# Patient Record
Sex: Female | Born: 1965
Health system: Southern US, Community
[De-identification: ages and names within clinical notes are randomized; demographics above are authoritative.]

## PROBLEM LIST (undated history)

## (undated) DIAGNOSIS — Z800 Family history of malignant neoplasm of digestive organs: Secondary | ICD-10-CM

## (undated) DIAGNOSIS — Z83719 Family history of colon polyps, unspecified: Secondary | ICD-10-CM

## (undated) DIAGNOSIS — E119 Type 2 diabetes mellitus without complications: Secondary | ICD-10-CM

## (undated) DIAGNOSIS — B029 Zoster without complications: Secondary | ICD-10-CM

## (undated) DIAGNOSIS — J45909 Unspecified asthma, uncomplicated: Secondary | ICD-10-CM

## (undated) DIAGNOSIS — I10 Essential (primary) hypertension: Secondary | ICD-10-CM

## (undated) DIAGNOSIS — K635 Polyp of colon: Secondary | ICD-10-CM

## (undated) DIAGNOSIS — R87619 Unspecified abnormal cytological findings in specimens from cervix uteri: Secondary | ICD-10-CM

## (undated) DIAGNOSIS — E785 Hyperlipidemia, unspecified: Secondary | ICD-10-CM

## (undated) DIAGNOSIS — Z8371 Family history of colonic polyps: Secondary | ICD-10-CM

## (undated) HISTORY — DX: Essential (primary) hypertension: I10

## (undated) HISTORY — DX: Family history of malignant neoplasm of digestive organs: Z80.0

## (undated) HISTORY — PX: APPENDECTOMY: SHX54

## (undated) HISTORY — DX: Family history of colon polyps, unspecified: Z83.719

## (undated) HISTORY — PX: CHOLECYSTECTOMY: SHX55

## (undated) HISTORY — DX: Unspecified abnormal cytological findings in specimens from cervix uteri: R87.619

## (undated) HISTORY — PX: TONSILLECTOMY AND ADENOIDECTOMY: SUR1326

## (undated) HISTORY — DX: Family history of colonic polyps: Z83.71

## (undated) HISTORY — DX: Hyperlipidemia, unspecified: E78.5

## (undated) HISTORY — DX: Type 2 diabetes mellitus without complications: E11.9

## (undated) HISTORY — PX: CERVICAL CONE BIOPSY: SUR198

## (undated) HISTORY — PX: COLONOSCOPY: SHX174

## (undated) HISTORY — PX: OTHER SURGICAL HISTORY: SHX169

## (undated) HISTORY — DX: Zoster without complications: B02.9

## (undated) HISTORY — DX: Unspecified asthma, uncomplicated: J45.909

## (undated) HISTORY — DX: Polyp of colon: K63.5

---

## 2018-12-02 ENCOUNTER — Ambulatory Visit: Payer: Self-pay | Admitting: Family Medicine

## 2018-12-02 ENCOUNTER — Encounter: Payer: Self-pay | Admitting: Family Medicine

## 2018-12-02 ENCOUNTER — Ambulatory Visit (INDEPENDENT_AMBULATORY_CARE_PROVIDER_SITE_OTHER): Payer: Self-pay | Admitting: Family Medicine

## 2018-12-02 DIAGNOSIS — I10 Essential (primary) hypertension: Secondary | ICD-10-CM

## 2018-12-02 DIAGNOSIS — E1169 Type 2 diabetes mellitus with other specified complication: Secondary | ICD-10-CM

## 2018-12-02 DIAGNOSIS — Z800 Family history of malignant neoplasm of digestive organs: Secondary | ICD-10-CM

## 2018-12-02 DIAGNOSIS — J302 Other seasonal allergic rhinitis: Secondary | ICD-10-CM

## 2018-12-02 DIAGNOSIS — Z124 Encounter for screening for malignant neoplasm of cervix: Secondary | ICD-10-CM

## 2018-12-02 DIAGNOSIS — E1165 Type 2 diabetes mellitus with hyperglycemia: Secondary | ICD-10-CM

## 2018-12-02 DIAGNOSIS — I152 Hypertension secondary to endocrine disorders: Secondary | ICD-10-CM | POA: Insufficient documentation

## 2018-12-02 DIAGNOSIS — E785 Hyperlipidemia, unspecified: Secondary | ICD-10-CM

## 2018-12-02 DIAGNOSIS — E1159 Type 2 diabetes mellitus with other circulatory complications: Secondary | ICD-10-CM

## 2018-12-02 NOTE — Assessment & Plan Note (Signed)
Stable.  Continue Zyrtec 10 mg daily. 

## 2018-12-02 NOTE — Assessment & Plan Note (Signed)
Stable.  Continue chlorthalidone 25 mg daily, metoprolol succinate 25 mg daily, and valsartan 80 mg daily.

## 2018-12-02 NOTE — Assessment & Plan Note (Signed)
Due for repeat screening in 2 years.  Will place referral to GI next year.

## 2018-12-02 NOTE — Progress Notes (Signed)
Chief Complaint:  April Clayton is a 53 y.o. female who presents today for a virtual office visit with a chief complaint of essential hypertension and to establish care.  Assessment/Plan:  Family history of colon cancer Due for repeat screening in 2 years.  Will place referral to GI next year.  Seasonal allergies Stable.  Continue Zyrtec 10 mg daily.  Dyslipidemia associated with type 2 diabetes mellitus (HCC) Stable.  Continue Lipitor 20 mg daily.  Type 2 diabetes mellitus with hyperglycemia, without long-term current use of insulin (HCC) Stable.  Continue metformin 500 mg twice daily.  Follow-up me in 3 to 4 months to recheck A1c.  Hypertension associated with diabetes ([place]) Stable.  Continue chlorthalidone 25 mg daily, metoprolol succinate 25 mg daily, and valsartan 80 mg daily.  Preventative Healthcare Patient was instructed to return soon for CPE.  Will place referral to ophthalmology for eye exam.  Will place referral to gynecology for Pap smear.  Advised her to get her mammogram.  She will be due for colon cancer screening in 2 years.  Due for pneumonia vaccine at next visit. Health Maintenance Due  Topic Date Due  . HEMOGLOBIN A1C  1966-04-12  . PNEUMOCOCCAL POLYSACCHARIDE VACCINE AGE 42-64 HIGH RISK  10/27/1967  . FOOT EXAM  10/27/1975  . OPHTHALMOLOGY EXAM  10/27/1975  . HIV Screening  10/26/1980  . TETANUS/TDAP  10/26/1984  . PAP SMEAR-Modifier  10/27/1986  . MAMMOGRAM  10/27/2015  . COLONOSCOPY  10/27/2015     Subjective:  HPI:  Her stable, chronic medical conditions are outlined below:  #Essential hypertension - On valsartan 80 mg daily, chlorthalidone 25 mg daily and metoprolol succinate 25 mg daily - ROS: No reported chest pain or shortness of breath  # Type 2 Diabetes - On metformin 500 mg twice daily and tolerating well - ROS: No reported polyuria or polydipsia  #Dyslipidemia - On Lipitor 20 mg daily and tolerating well - ROS: No reported  myalgias.  #Seasonal allergies - On Zyrtec 10 mg daily and tolerating well.  # Family History of Colon Cancer - Gets colonoscopy every 3 years - last in 2019  ROS: Per HPI, otherwise a complete review of systems was negative.   PMH:  The following were reviewed and entered/updated in epic: Past Medical History:  Diagnosis Date  . Childhood asthma   . Hyperlipidemia   . Hypertension    Patient Active Problem List   Diagnosis Date Noted  . Hypertension associated with diabetes ([place]) 12/02/2018  . Type 2 diabetes mellitus with hyperglycemia, without long-term current use of insulin ([place]) 12/02/2018  . Dyslipidemia associated with type 2 diabetes mellitus ([place]) 12/02/2018  . Seasonal allergies 12/02/2018  . Family history of colon cancer 12/02/2018   Past Surgical History:  Procedure Laterality Date  . APPENDECTOMY    . CHOLECYSTECTOMY    . TONSILLECTOMY AND ADENOIDECTOMY      Family History  Problem Relation Age of Onset  . Colon cancer Mother   . Colon cancer Brother   . Colon cancer Maternal Grandmother     Medications- reviewed and updated Current Outpatient Medications  Medication Sig Dispense Refill  . atorvastatin (LIPITOR) 20 MG tablet Take 20 mg by mouth daily.    . cetirizine (ZYRTEC) 10 MG tablet Take 10 mg by mouth daily.    . chlorthalidone (HYGROTON) 25 MG tablet Take 25 mg by mouth daily.     . metFORMIN (GLUCOPHAGE-XR) 500 MG 24 hr tablet Take 500 mg by  mouth 2 (two) times daily.     . metoprolol succinate (TOPROL-XL) 25 MG 24 hr tablet Take 25 mg by mouth daily.    . potassium chloride (MICRO-K) 10 MEQ CR capsule Take 10 mEq by mouth 2 (two) times daily.    . valsartan (DIOVAN) 80 MG tablet Take 80 mg by mouth daily.      No current facility-administered medications for this visit.     Allergies-reviewed and updated Allergies  Allergen Reactions  . Nonoxynol-9     Social History   Socioeconomic History  . Marital status: Not on file     Spouse name: Not on file  . Number of children: Not on file  . Years of education: Not on file  . Highest education level: Not on file  Occupational History  . Not on file  Social Needs  . Financial resource strain: Not on file  . Food insecurity    Worry: Not on file    Inability: Not on file  . Transportation needs    Medical: Not on file    Non-medical: Not on file  Tobacco Use  . Smoking status: Never Smoker  . Smokeless tobacco: Never Used  Substance and Sexual Activity  . Alcohol use: Yes  . Drug use: Never  . Sexual activity: Yes  Lifestyle  . Physical activity    Days per week: Not on file    Minutes per session: Not on file  . Stress: Not on file  Relationships  . Social Herbalist on phone: Not on file    Gets together: Not on file    Attends religious service: Not on file    Active member of club or organization: Not on file    Attends meetings of clubs or organizations: Not on file    Relationship status: Not on file  Other Topics Concern  . Not on file  Social History Narrative  . Not on file          Objective/Observations  Physical Exam: Gen: NAD, resting comfortably Eyes: Extraocular movements intact.  No scleral icterus Cardiovascular: No peripheral edema Pulm: Normal work of breathing Neuro: Grossly normal, moves all extremities MSK: Full range of motion throughout upper extremities.  No digital cyanosis Skin: No apparent rashes or lesions. Psych: Normal affect and thought content  Virtual Visit via Video   I connected with Rilynn Habel on 12/02/18 at  3:40 PM EDT by a video enabled telemedicine application and verified that I am speaking with the correct person using two identifiers. I discussed the limitations of evaluation and management by telemedicine and the availability of in person appointments. The patient expressed understanding and agreed to proceed.   Patient location: Home Provider location: [place] participating in the virtual visit: Myself and Patient     Algis Greenhouse. [name], MD 12/02/2018 4:33 PM

## 2018-12-02 NOTE — Assessment & Plan Note (Signed)
Stable.  Continue metformin 500 mg twice daily.  Follow-up me in 3 to 4 months to recheck A1c.

## 2018-12-02 NOTE — Assessment & Plan Note (Signed)
Stable.  Continue Lipitor 20 mg daily. 

## 2018-12-03 ENCOUNTER — Encounter: Payer: Self-pay | Admitting: Certified Nurse Midwife

## 2018-12-12 ENCOUNTER — Other Ambulatory Visit: Payer: Self-pay | Admitting: Family Medicine

## 2018-12-12 DIAGNOSIS — Z1231 Encounter for screening mammogram for malignant neoplasm of breast: Secondary | ICD-10-CM

## 2019-01-27 ENCOUNTER — Other Ambulatory Visit: Payer: Self-pay

## 2019-01-27 ENCOUNTER — Ambulatory Visit
Admission: RE | Admit: 2019-01-27 | Discharge: 2019-01-27 | Disposition: A | Discharge status: Home / Self Care | Payer: 59 | Source: Ambulatory Visit | Attending: Family Medicine | Admitting: Family Medicine

## 2019-01-27 DIAGNOSIS — Z1231 Encounter for screening mammogram for malignant neoplasm of breast: Secondary | ICD-10-CM

## 2019-02-18 ENCOUNTER — Encounter: Payer: Self-pay | Admitting: Family Medicine

## 2019-02-18 ENCOUNTER — Ambulatory Visit (INDEPENDENT_AMBULATORY_CARE_PROVIDER_SITE_OTHER): Payer: 59

## 2019-02-18 DIAGNOSIS — Z23 Encounter for immunization: Secondary | ICD-10-CM | POA: Diagnosis not present

## 2019-02-27 ENCOUNTER — Telehealth: Payer: Self-pay

## 2019-02-27 NOTE — Telephone Encounter (Signed)
Pt mentioned labs were talked about at last visit. I did not see any orders in. Please advice.

## 2019-02-27 NOTE — Telephone Encounter (Signed)
Patient was told at last visit to return soon for CPE.  Labs would be done at CPE.

## 2019-02-28 NOTE — Telephone Encounter (Signed)
Dr. [name] wanted her to follow up 3-4 months from her last appointment on 12/02/2018 for her A1c.  She will need a follow up.  Thanks.

## 2019-02-28 NOTE — Telephone Encounter (Signed)
Pt stated she had full panel drawn 09/26/18 from her previous doctor and I have scheduled her for cpe next April. Pt stated she usually has the hemoglobin A1C done every 6 months. Please advise if this is needed.

## 2019-03-31 ENCOUNTER — Other Ambulatory Visit: Payer: Self-pay

## 2019-03-31 ENCOUNTER — Encounter: Payer: Self-pay | Admitting: Family Medicine

## 2019-03-31 ENCOUNTER — Ambulatory Visit (INDEPENDENT_AMBULATORY_CARE_PROVIDER_SITE_OTHER): Payer: 59 | Admitting: Family Medicine

## 2019-03-31 VITALS — BP 128/76 | HR 92 | Temp 97.7°F | Ht 68.0 in | Wt 202.0 lb

## 2019-03-31 DIAGNOSIS — E1159 Type 2 diabetes mellitus with other circulatory complications: Secondary | ICD-10-CM | POA: Diagnosis not present

## 2019-03-31 DIAGNOSIS — I152 Hypertension secondary to endocrine disorders: Secondary | ICD-10-CM

## 2019-03-31 DIAGNOSIS — I10 Essential (primary) hypertension: Secondary | ICD-10-CM

## 2019-03-31 DIAGNOSIS — E785 Hyperlipidemia, unspecified: Secondary | ICD-10-CM

## 2019-03-31 DIAGNOSIS — E1165 Type 2 diabetes mellitus with hyperglycemia: Secondary | ICD-10-CM | POA: Diagnosis not present

## 2019-03-31 DIAGNOSIS — E1169 Type 2 diabetes mellitus with other specified complication: Secondary | ICD-10-CM | POA: Diagnosis not present

## 2019-03-31 LAB — POCT GLYCOSYLATED HEMOGLOBIN (HGB A1C): Hemoglobin A1C: 6.3 % — AB (ref 4.0–5.6)

## 2019-03-31 NOTE — Patient Instructions (Signed)
It was very nice to see you today!  Your A1c looks great! Keep up the good work.  No changes today.  Come back in 6 months, or sooner if needed.  Take care, Dr [name]  Please try these tips to maintain a healthy lifestyle:   Eat at least 3 REAL meals and 1-2 snacks per day.  Aim for no more than 5 hours between eating.  If you eat breakfast, please do so within one hour of getting up.    Obtain twice as many fruits/vegetables as protein or carbohydrate foods for both lunch and dinner. (Half of each meal should be fruits/vegetables, one quarter protein, and one quarter starchy carbs)   Cut down on sweet beverages. This includes juice, soda, and sweet tea.    Exercise at least 150 minutes every week.

## 2019-03-31 NOTE — Progress Notes (Signed)
   Chief Complaint:  April Clayton is a 53 y.o. female who presents today with a chief complaint of T2DM follow up.   Assessment/Plan:  Dyslipidemia associated with type 2 diabetes mellitus (HCC) Continue Lipitor 20 mg daily.  Check lipid panel next blood draw.  Type 2 diabetes mellitus with hyperglycemia, without long-term current use of insulin (HCC) A1c 6.3.  Continue Metformin 500 mg twice daily.  Continue diet and lifestyle modifications.  Hypertension associated with diabetes ([place]) At goal.  Continue chlorthalidone 25 mg daily, metoprolol succinate 25 mg daily, and valsartan 80 mg daily.    Subjective:  HPI:  Her stable, chronic medical conditions are outlined below:   #Essential hypertension - On valsartan 80 mg daily, chlorthalidone 25 mg daily and metoprolol succinate 25 mg daily - ROS: No reported chest pain or shortness of breath  # Type 2 Diabetes - On metformin 500 mg twice daily and tolerating well - ROS: No reported polyuria or polydipsia  #Dyslipidemia - On Lipitor 20 mg daily and tolerating well - ROS: No reported myalgias.  #Seasonal allergies - On Zyrtec 10 mg daily and tolerating well.  # Family History of Colon Cancer - Gets colonoscopy every 3 years - last in 2019  ROS: Per HPI  PMH: She reports that she has never smoked. She has never used smokeless tobacco. She reports current alcohol use. She reports that she does not use drugs.      Objective:  Physical Exam: BP 128/76   Pulse 92   Temp 97.7 F (36.5 C)   Ht 5\' 8"  (1.727 m)   Wt 202 lb (91.6 kg)   SpO2 97%   BMI 30.71 kg/m   Gen: NAD, resting comfortably CV: Regular rate and rhythm with no murmurs appreciated Pulm: Normal work of breathing, clear to auscultation bilaterally with no crackles, wheezes, or rhonchi GI: Normal bowel sounds present. Soft, Nontender, Nondistended. MSK: No edema, cyanosis, or clubbing noted Skin: Warm, dry Neuro: Grossly normal, moves all  extremities Psych: Normal affect and thought content  Results for orders placed or performed in visit on 03/31/19 (from the past 24 hour(s))  POCT HgB A1C     Status: Abnormal   Collection Time: 03/31/19  3:30 PM  Result Value Ref Range   Hemoglobin A1C 6.3 (A) 4.0 - 5.6 %         M. [name], MD 03/31/2019 3:38 PM

## 2019-03-31 NOTE — Assessment & Plan Note (Signed)
Continue Lipitor 20 mg daily.  Check lipid panel next blood draw. 

## 2019-03-31 NOTE — Assessment & Plan Note (Signed)
At goal.  Continue chlorthalidone 25 mg daily, metoprolol succinate 25 mg daily, and valsartan 80 mg daily.

## 2019-03-31 NOTE — Assessment & Plan Note (Signed)
A1c 6.3.  Continue Metformin 500 mg twice daily.  Continue diet and lifestyle modifications.

## 2019-05-09 ENCOUNTER — Ambulatory Visit: Payer: Self-pay

## 2019-05-09 ENCOUNTER — Other Ambulatory Visit: Payer: Self-pay

## 2019-05-09 DIAGNOSIS — Z20822 Contact with and (suspected) exposure to covid-19: Secondary | ICD-10-CM

## 2019-05-09 NOTE — Telephone Encounter (Signed)
Pt. Reports she started feeling bad last Saturday with nasal congestion and then developed lethargy.Cares for an elderly family member and is concerned about COVID 46. Given information on testing sites.  Answer Assessment - Initial Assessment Questions 1. COVID-19 DIAGNOSIS: "Who made your Coronavirus (COVID-19) diagnosis?" "Was it confirmed by a positive lab test?" If not diagnosed by a HCP, ask "Are there lots of cases (community spread) where you live?" (See public health department website, if unsure)     No test 2. COVID-19 EXPOSURE: "Was there any known exposure to COVID before the symptoms began?" CDC Definition of close contact: within 6 feet (2 meters) for a total of 15 minutes or more over a 24-hour period.      No 3. ONSET: "When did the COVID-19 symptoms start?"      Saturday 4. WORST SYMPTOM: "What is your worst symptom?" (e.g., cough, fever, shortness of breath, muscle aches)     Congested and tired 5. COUGH: "Do you have a cough?" If so, ask: "How bad is the cough?"       No 6. FEVER: "Do you have a fever?" If so, ask: "What is your temperature, how was it measured, and when did it start?"     No 7. RESPIRATORY STATUS: "Describe your breathing?" (e.g., shortness of breath, wheezing, unable to speak)      No 8. BETTER-SAME-WORSE: "Are you getting better, staying the same or getting worse compared to yesterday?"  If getting worse, ask, "In what way?"     Better 9. HIGH RISK DISEASE: "Do you have any chronic medical problems?" (e.g., asthma, heart or lung disease, weak immune system, obesity, etc.)     Diabetes, HTN 10. PREGNANCY: "Is there any chance you are pregnant?" "When was your last menstrual period?"       No 11. OTHER SYMPTOMS: "Do you have any other symptoms?"  (e.g., chills, fatigue, headache, loss of smell or taste, muscle pain, sore throat; new loss of smell or taste especially support the diagnosis of COVID-19)       No  Protocols used: CORONAVIRUS (COVID-19)  DIAGNOSED OR SUSPECTED-A-AH

## 2019-05-11 LAB — NOVEL CORONAVIRUS, NAA: SARS-CoV-2, NAA: NOT DETECTED

## 2019-05-28 ENCOUNTER — Other Ambulatory Visit: Payer: 59

## 2019-05-28 ENCOUNTER — Ambulatory Visit: Payer: 59 | Attending: Internal Medicine

## 2019-05-28 DIAGNOSIS — Z20822 Contact with and (suspected) exposure to covid-19: Secondary | ICD-10-CM

## 2019-05-29 LAB — NOVEL CORONAVIRUS, NAA: SARS-CoV-2, NAA: NOT DETECTED

## 2019-06-02 ENCOUNTER — Encounter: Payer: Self-pay | Admitting: Family Medicine

## 2019-06-03 ENCOUNTER — Other Ambulatory Visit: Payer: Self-pay

## 2019-06-03 DIAGNOSIS — E1165 Type 2 diabetes mellitus with hyperglycemia: Secondary | ICD-10-CM

## 2019-06-03 MED ORDER — METFORMIN HCL ER 500 MG PO TB24: 500.0000 mg | ORAL_TABLET | Freq: Two times a day (BID) | ORAL | 3 refills | Status: DC

## 2019-06-03 MED ORDER — CHLORTHALIDONE 25 MG PO TABS: 25.0000 mg | ORAL_TABLET | Freq: Every day | ORAL | 3 refills | Status: DC

## 2019-08-18 ENCOUNTER — Encounter: Payer: Self-pay | Admitting: Certified Nurse Midwife

## 2019-08-26 ENCOUNTER — Encounter: Payer: Self-pay | Admitting: Family Medicine

## 2019-08-26 DIAGNOSIS — E1159 Type 2 diabetes mellitus with other circulatory complications: Secondary | ICD-10-CM

## 2019-08-26 DIAGNOSIS — I152 Hypertension secondary to endocrine disorders: Secondary | ICD-10-CM

## 2019-08-27 NOTE — Telephone Encounter (Signed)
Okay to refill Metoprolol.

## 2019-08-28 ENCOUNTER — Other Ambulatory Visit: Payer: Self-pay | Admitting: *Deleted

## 2019-08-28 MED ORDER — METFORMIN HCL ER 500 MG PO TB24: 500.0000 mg | ORAL_TABLET | Freq: Two times a day (BID) | ORAL | 3 refills | Status: DC

## 2019-08-28 MED ORDER — METOPROLOL SUCCINATE ER 25 MG PO TB24: 25.0000 mg | ORAL_TABLET | Freq: Every day | ORAL | 3 refills | Status: DC

## 2019-09-15 ENCOUNTER — Ambulatory Visit (INDEPENDENT_AMBULATORY_CARE_PROVIDER_SITE_OTHER): Payer: 59 | Admitting: Family Medicine

## 2019-09-15 ENCOUNTER — Other Ambulatory Visit: Payer: Self-pay

## 2019-09-15 ENCOUNTER — Encounter: Payer: Self-pay | Admitting: Family Medicine

## 2019-09-15 VITALS — BP 142/88 | HR 75 | Temp 97.8°F | Ht 68.0 in | Wt 198.2 lb

## 2019-09-15 DIAGNOSIS — I10 Essential (primary) hypertension: Secondary | ICD-10-CM

## 2019-09-15 DIAGNOSIS — Z6830 Body mass index (BMI) 30.0-30.9, adult: Secondary | ICD-10-CM

## 2019-09-15 DIAGNOSIS — E669 Obesity, unspecified: Secondary | ICD-10-CM

## 2019-09-15 DIAGNOSIS — E1169 Type 2 diabetes mellitus with other specified complication: Secondary | ICD-10-CM | POA: Diagnosis not present

## 2019-09-15 DIAGNOSIS — I152 Hypertension secondary to endocrine disorders: Secondary | ICD-10-CM

## 2019-09-15 DIAGNOSIS — E1165 Type 2 diabetes mellitus with hyperglycemia: Secondary | ICD-10-CM

## 2019-09-15 DIAGNOSIS — E785 Hyperlipidemia, unspecified: Secondary | ICD-10-CM

## 2019-09-15 DIAGNOSIS — E1159 Type 2 diabetes mellitus with other circulatory complications: Secondary | ICD-10-CM | POA: Diagnosis not present

## 2019-09-15 DIAGNOSIS — Z0001 Encounter for general adult medical examination with abnormal findings: Secondary | ICD-10-CM | POA: Diagnosis not present

## 2019-09-15 LAB — COMPREHENSIVE METABOLIC PANEL
ALT: 69 U/L — ABNORMAL HIGH (ref 0–35)
AST: 38 U/L — ABNORMAL HIGH (ref 0–37)
Albumin: 4.4 g/dL (ref 3.5–5.2)
Alkaline Phosphatase: 107 U/L (ref 39–117)
BUN: 16 mg/dL (ref 6–23)
CO2: 31 mEq/L (ref 19–32)
Calcium: 9.4 mg/dL (ref 8.4–10.5)
Chloride: 95 mEq/L — ABNORMAL LOW (ref 96–112)
Creatinine, Ser: 0.6 mg/dL (ref 0.40–1.20)
GFR: 104.22 mL/min (ref >60.00)
Glucose, Bld: 264 mg/dL — ABNORMAL HIGH (ref 70–99)
Potassium: 3.2 mEq/L — ABNORMAL LOW (ref 3.5–5.1)
Sodium: 135 mEq/L (ref 135–145)
Total Bilirubin: 0.4 mg/dL (ref 0.2–1.2)
Total Protein: 6.9 g/dL (ref 6.0–8.3)

## 2019-09-15 LAB — LDL CHOLESTEROL, DIRECT: Direct LDL: 94 mg/dL

## 2019-09-15 LAB — CBC
HCT: 41.6 % (ref 36.0–46.0)
Hemoglobin: 14.4 g/dL (ref 12.0–15.0)
MCHC: 34.6 g/dL (ref 30.0–36.0)
MCV: 89 fl (ref 78.0–100.0)
Platelets: 246 10*3/uL (ref 150.0–400.0)
RBC: 4.67 Mil/uL (ref 3.87–5.11)
RDW: 13.3 % (ref 11.5–15.5)
WBC: 7.8 10*3/uL (ref 4.0–10.5)

## 2019-09-15 LAB — LIPID PANEL
Cholesterol: 164 mg/dL (ref 0–200)
HDL: 28.6 mg/dL — ABNORMAL LOW (ref >39.00)
NonHDL: 135.85
Total CHOL/HDL Ratio: 6
Triglycerides: 344 mg/dL — ABNORMAL HIGH (ref 0.0–149.0)
VLDL: 68.8 mg/dL — ABNORMAL HIGH (ref 0.0–40.0)

## 2019-09-15 LAB — HEMOGLOBIN A1C: Hgb A1c MFr Bld: 9.5 % — ABNORMAL HIGH (ref 4.6–6.5)

## 2019-09-15 LAB — TSH: TSH: 1.54 u[IU]/mL (ref 0.35–4.50)

## 2019-09-15 MED ORDER — VALSARTAN 160 MG PO TABS: 160.0000 mg | ORAL_TABLET | Freq: Every day | ORAL | 3 refills | Status: DC

## 2019-09-15 NOTE — Assessment & Plan Note (Signed)
Check A1c.  Continue Metformin 500 mg twice daily. 

## 2019-09-15 NOTE — Assessment & Plan Note (Signed)
Continue Lipitor 10 mg daily.  Collect lipid panel today.

## 2019-09-15 NOTE — Progress Notes (Signed)
Chief Complaint:  April Clayton is a 54 y.o. female who presents today for her annual comprehensive physical exam.    Assessment/Plan:  Chronic Problems Addressed Today: Dyslipidemia associated with type 2 diabetes mellitus (HCC) Continue Lipitor 10 mg daily.  Collect lipid panel today.  Type 2 diabetes mellitus with hyperglycemia, without long-term current use of insulin (HCC) Check A1c.  Continue Metformin 500 mg twice daily.  Hypertension associated with diabetes ([place]) Stop chlorthalidone.  Increased dose of valsartan 160 mg daily.  Continue metoprolol succinate 25 mg daily.  Discussed lifestyle modifications.  Check CBC, C met, TSH.  Continue home monitoring goal 140/90 or lower.  Body mass index is 30.14 kg/m. / Obese BMI Metric Follow Up - 09/15/19 1322      BMI Metric Follow Up-Please document annually   BMI Metric Follow Up  Education provided       Preventative Healthcare: Has received both doses of Covid vaccine.  Due for Pap smear in 2024 and colonoscopy in 2022.  Check labs including CBC, C met, TSH, and lipid panel.  Patient Counseling(The following topics were reviewed and/or handout was given):  -Nutrition: Stressed importance of moderation in sodium/caffeine intake, saturated fat and cholesterol, caloric balance, sufficient intake of fresh fruits, vegetables, and fiber.  -Stressed the importance of regular exercise.   -Substance Abuse: Discussed cessation/primary prevention of tobacco, alcohol, or other drug use; driving or other dangerous activities under the influence; availability of treatment for abuse.   -Injury prevention: Discussed safety belts, safety helmets, smoke detector, smoking near bedding or upholstery.   -Sexuality: Discussed sexually transmitted diseases, partner selection, use of condoms, avoidance of unintended pregnancy and contraceptive alternatives.   -Dental health: Discussed importance of regular tooth brushing, flossing, and dental  visits.  -Health maintenance and immunizations reviewed. Please refer to Health maintenance section.  Return to care in 1 year for next preventative visit.     Subjective:  HPI:  She has no acute complaints today.   She has been under increased stress recently.  Her mother-in-law recently moved into a SNF.  Her mother injured her self is now living with her husband.  She is taking potassium supplementation with her chlorthalidone which causes excessive bowel movements.  She would like to stop today.  Lifestyle Diet: Balanced. Plenty of fruits and vegetables.  Exercise: Likes to go on hikes. Busy most of the   Depression screen PHQ 2/9 03/31/2019  Decreased Interest 0  Down, Depressed, Hopeless 0  PHQ - 2 Score 0    Health Maintenance Due  Topic Date Due  . FOOT EXAM  Never done  . OPHTHALMOLOGY EXAM  Never done  . URINE MICROALBUMIN  Never done  . HIV Screening  Never done     ROS: Per HPI, otherwise a complete review of systems was negative.   PMH:  The following were reviewed and entered/updated in epic: Past Medical History:  Diagnosis Date  . Childhood asthma   . Hyperlipidemia   . Hypertension    Patient Active Problem List   Diagnosis Date Noted  . Hypertension associated with diabetes ([place]) 12/02/2018  . Type 2 diabetes mellitus with hyperglycemia, without long-term current use of insulin ([place]) 12/02/2018  . Dyslipidemia associated with type 2 diabetes mellitus ([place]) 12/02/2018  . Seasonal allergies 12/02/2018  . Family history of colon cancer 12/02/2018   Past Surgical History:  Procedure Laterality Date  . APPENDECTOMY    . CHOLECYSTECTOMY    . TONSILLECTOMY AND ADENOIDECTOMY  Family History  Problem Relation Age of Onset  . Colon cancer Mother   . Diabetes Mother   . Colon cancer Brother   . Diabetes Brother   . Colon cancer Maternal Grandmother   . Diabetes Paternal Grandmother     Medications- reviewed and updated Current Outpatient  Medications  Medication Sig Dispense Refill  . atorvastatin (LIPITOR) 20 MG tablet Take 20 mg by mouth daily.    . cetirizine (ZYRTEC) 10 MG tablet Take 10 mg by mouth daily.    . metFORMIN (GLUCOPHAGE-XR) 500 MG 24 hr tablet Take 1 tablet (500 mg total) by mouth 2 (two) times daily. 180 tablet 3  . metoprolol succinate (TOPROL-XL) 25 MG 24 hr tablet Take 1 tablet (25 mg total) by mouth daily. 90 tablet 3  . valsartan (DIOVAN) 160 MG tablet Take 1 tablet (160 mg total) by mouth daily. 90 tablet 3   No current facility-administered medications for this visit.    Allergies-reviewed and updated Allergies  Allergen Reactions  . Nonoxynol-9     Social History   Socioeconomic History  . Marital status: Married    Spouse name: Not on file  . Number of children: Not on file  . Years of education: Not on file  . Highest education level: Not on file  Occupational History  . Not on file  Tobacco Use  . Smoking status: Never Smoker  . Smokeless tobacco: Never Used  Substance and Sexual Activity  . Alcohol use: Yes  . Drug use: Never  . Sexual activity: Yes  Other Topics Concern  . Not on file  Social History Narrative  . Not on file   Social Determinants of Health   Financial Resource Strain:   . Difficulty of Paying Living Expenses:   Food Insecurity:   . Worried About Charity fundraiser in the Last Year:   . Arboriculturist in the Last Year:   Transportation Needs:   . Film/video editor (Medical):   Marland Kitchen Lack of Transportation (Non-Medical):   Physical Activity:   . Days of Exercise per Week:   . Minutes of Exercise per Session:   Stress:   . Feeling of Stress :   Social Connections:   . Frequency of Communication with Friends and Family:   . Frequency of Social Gatherings with Friends and Family:   . Attends Religious Services:   . Active Member of Clubs or Organizations:   . Attends Archivist Meetings:   Marland Kitchen Marital Status:         Objective:    Physical Exam: BP (!) 142/88 (BP Location: Left Arm, Patient Position: Sitting, Cuff Size: Normal)   Pulse 75   Temp 97.8 F (36.6 C) (Temporal)   Ht '5\' 8"'  (1.727 m)   Wt 198 lb 3.2 oz (89.9 kg)   SpO2 97%   BMI 30.14 kg/m   Body mass index is 30.14 kg/m. Wt Readings from Last 3 Encounters:  09/15/19 198 lb 3.2 oz (89.9 kg)  03/31/19 202 lb (91.6 kg)   Gen: NAD, resting comfortably HEENT: TMs normal bilaterally. OP clear. No thyromegaly noted.  CV: RRR with no murmurs appreciated Pulm: NWOB, CTAB with no crackles, wheezes, or rhonchi GI: Normal bowel sounds present. Soft, Nontender, Nondistended. MSK: no edema, cyanosis, or clubbing noted Skin: warm, dry Neuro: CN2-12 grossly intact. Strength 5/5 in upper and lower extremities. Reflexes symmetric and intact bilaterally.  Psych: Normal affect and thought content  Algis Greenhouse. [name], MD 09/15/2019 2:17 PM

## 2019-09-15 NOTE — Patient Instructions (Signed)
It was very nice to see you today!  Please stop the chlorthalidone.  Please double up your dose of valsartan to 160 mg daily.  We will check blood work today.  Come back in 6 months to recheck your blood sugar, or sooner if needed.  Take care, Dr [name]  Please try these tips to maintain a healthy lifestyle:   Eat at least 3 REAL meals and 1-2 snacks per day.  Aim for no more than 5 hours between eating.  If you eat breakfast, please do so within one hour of getting up.    Each meal should contain half fruits/vegetables, one quarter protein, and one quarter carbs (no bigger than a computer mouse)   Cut down on sweet beverages. This includes juice, soda, and sweet tea.     Drink at least 1 glass of water with each meal and aim for at least 8 glasses per day   Exercise at least 150 minutes every week.    Preventive Care 13-110 Years Old, Female Preventive care refers to visits with your health care provider and lifestyle choices that can promote health and wellness. This includes:  A yearly physical exam. This may also be called an annual well check.  Regular dental visits and eye exams.  Immunizations.  Screening for certain conditions.  Healthy lifestyle choices, such as eating a healthy diet, getting regular exercise, not using drugs or products that contain nicotine and tobacco, and limiting alcohol use. What can I expect for my preventive care visit? Physical exam Your health care provider will check your:  Height and weight. This may be used to calculate body mass index (BMI), which tells if you are at a healthy weight.  Heart rate and blood pressure.  Skin for abnormal spots. Counseling Your health care provider may ask you questions about your:  Alcohol, tobacco, and drug use.  Emotional well-being.  Home and relationship well-being.  Sexual activity.  Eating habits.  Work and work Statistician.  Method of birth control.  Menstrual  cycle.  Pregnancy history. What immunizations do I need?  Influenza (flu) vaccine  This is recommended every year. Tetanus, diphtheria, and pertussis (Tdap) vaccine  You may need a Td booster every 10 years. Varicella (chickenpox) vaccine  You may need this if you have not been vaccinated. Zoster (shingles) vaccine  You may need this after age 7. Measles, mumps, and rubella (MMR) vaccine  You may need at least one dose of MMR if you were born in 1957 or later. You may also need a second dose. Pneumococcal conjugate (PCV13) vaccine  You may need this if you have certain conditions and were not previously vaccinated. Pneumococcal polysaccharide (PPSV23) vaccine  You may need one or two doses if you smoke cigarettes or if you have certain conditions. Meningococcal conjugate (MenACWY) vaccine  You may need this if you have certain conditions. Hepatitis A vaccine  You may need this if you have certain conditions or if you travel or work in places where you may be exposed to hepatitis A. Hepatitis B vaccine  You may need this if you have certain conditions or if you travel or work in places where you may be exposed to hepatitis B. Haemophilus influenzae type b (Hib) vaccine  You may need this if you have certain conditions. Human papillomavirus (HPV) vaccine  If recommended by your health care provider, you may need three doses over 6 months. You may receive vaccines as individual doses or as more than one  vaccine together in one shot (combination vaccines). Talk with your health care provider about the risks and benefits of combination vaccines. What tests do I need? Blood tests  Lipid and cholesterol levels. These may be checked every 5 years, or more frequently if you are over 6 years old.  Hepatitis C test.  Hepatitis B test. Screening  Lung cancer screening. You may have this screening every year starting at age 75 if you have a 30-pack-year history of smoking and  currently smoke or have quit within the past 15 years.  Colorectal cancer screening. All adults should have this screening starting at age 33 and continuing until age 48. Your health care provider may recommend screening at age 73 if you are at increased risk. You will have tests every 1-10 years, depending on your results and the type of screening test.  Diabetes screening. This is done by checking your blood sugar (glucose) after you have not eaten for a while (fasting). You may have this done every 1-3 years.  Mammogram. This may be done every 1-2 years. Talk with your health care provider about when you should start having regular mammograms. This may depend on whether you have a family history of breast cancer.  BRCA-related cancer screening. This may be done if you have a family history of breast, ovarian, tubal, or peritoneal cancers.  Pelvic exam and Pap test. This may be done every 3 years starting at age 32. Starting at age 41, this may be done every 5 years if you have a Pap test in combination with an HPV test. Other tests  Sexually transmitted disease (STD) testing.  Bone density scan. This is done to screen for osteoporosis. You may have this scan if you are at high risk for osteoporosis. Follow these instructions at home: Eating and drinking  Eat a diet that includes fresh fruits and vegetables, whole grains, lean protein, and low-fat dairy.  Take vitamin and mineral supplements as recommended by your health care provider.  Do not drink alcohol if: ? Your health care provider tells you not to drink. ? You are pregnant, may be pregnant, or are planning to become pregnant.  If you drink alcohol: ? Limit how much you have to 0-1 drink a day. ? Be aware of how much alcohol is in your drink. In the [place] one drink equals one 12 oz bottle of beer (355 mL), one 5 oz glass of wine (148 mL), or one 1 oz glass of hard liquor (44 mL). Lifestyle  Take daily care of your teeth and  gums.  Stay active. Exercise for at least 30 minutes on 5 or more days each week.  Do not use any products that contain nicotine or tobacco, such as cigarettes, e-cigarettes, and chewing tobacco. If you need help quitting, ask your health care provider.  If you are sexually active, practice safe sex. Use a condom or other form of birth control (contraception) in order to prevent pregnancy and STIs (sexually transmitted infections).  If told by your health care provider, take low-dose aspirin daily starting at age 46. What's next?  Visit your health care provider once a year for a well check visit.  Ask your health care provider how often you should have your eyes and teeth checked.  Stay up to date on all vaccines. This information is not intended to replace advice given to you by your health care provider. Make sure you discuss any questions you have with your health care provider. Document  Revised: 01/24/2018 Document Reviewed: 01/24/2018 Elsevier Patient Education  [place].

## 2019-09-15 NOTE — Assessment & Plan Note (Signed)
Stop chlorthalidone.  Increased dose of valsartan 160 mg daily.  Continue metoprolol succinate 25 mg daily.  Discussed lifestyle modifications.  Check CBC, C met, TSH.  Continue home monitoring goal 140/90 or lower.

## 2019-09-16 ENCOUNTER — Other Ambulatory Visit: Payer: Self-pay | Admitting: *Deleted

## 2019-09-16 DIAGNOSIS — E1165 Type 2 diabetes mellitus with hyperglycemia: Secondary | ICD-10-CM

## 2019-09-16 MED ORDER — METFORMIN HCL 1000 MG PO TABS: 1000.0000 mg | ORAL_TABLET | Freq: Two times a day (BID) | ORAL | 3 refills | Status: DC

## 2019-09-16 NOTE — Progress Notes (Signed)
c-met  

## 2019-09-16 NOTE — Progress Notes (Signed)
Please inform patient of the following:  Potassium is low - this should improve now that we have her off her diuretic. Would like to recheck in few weeks to make that it is improving. Her blood sugar and liver numbers are elevated. I think the increased liver numbers are due to her sugar.  We can recheck her liver numbers in a few weeks as well. Please place future order for CMET.   Her a1c is 9.5. Would like for her to increase her metformin to 1000mg  twice daily. We should recheck her a1c in 3 months.   Everything else is STABLE.  Algis Greenhouse. [name], MD 09/16/2019 1:33 PM

## 2019-11-14 ENCOUNTER — Telehealth: Payer: Self-pay | Admitting: Family Medicine

## 2019-11-14 NOTE — Telephone Encounter (Signed)
Pt husband called stating pt needs to make an appt to get lab work done for her employer. Biometrics for employers are due by 8/15. Pt needs BP, triglycerides, glucose, LDL, & HDL. Pt had physical back in April.  Please advise.

## 2019-11-14 NOTE — Telephone Encounter (Signed)
See below

## 2019-11-14 NOTE — Telephone Encounter (Signed)
Please advise 

## 2019-11-17 NOTE — Telephone Encounter (Signed)
LVM to return call and schedule appointment

## 2019-11-17 NOTE — Telephone Encounter (Signed)
Ok with me. Please place any necessary orders. 

## 2019-11-18 ENCOUNTER — Other Ambulatory Visit: Payer: Self-pay | Admitting: Family Medicine

## 2019-11-18 ENCOUNTER — Other Ambulatory Visit: Payer: Self-pay | Admitting: *Deleted

## 2019-11-18 DIAGNOSIS — Z0001 Encounter for general adult medical examination with abnormal findings: Secondary | ICD-10-CM

## 2019-11-18 DIAGNOSIS — E1169 Type 2 diabetes mellitus with other specified complication: Secondary | ICD-10-CM

## 2019-11-18 NOTE — Telephone Encounter (Signed)
Pt husband notified, lab placed  Pt will schedule appt

## 2019-11-18 NOTE — Progress Notes (Signed)
.  li

## 2019-12-15 ENCOUNTER — Other Ambulatory Visit: Payer: Self-pay

## 2019-12-15 ENCOUNTER — Other Ambulatory Visit (INDEPENDENT_AMBULATORY_CARE_PROVIDER_SITE_OTHER): Payer: 59

## 2019-12-15 DIAGNOSIS — Z0001 Encounter for general adult medical examination with abnormal findings: Secondary | ICD-10-CM | POA: Diagnosis not present

## 2019-12-15 DIAGNOSIS — E1165 Type 2 diabetes mellitus with hyperglycemia: Secondary | ICD-10-CM

## 2019-12-15 DIAGNOSIS — E1169 Type 2 diabetes mellitus with other specified complication: Secondary | ICD-10-CM | POA: Diagnosis not present

## 2019-12-15 DIAGNOSIS — E785 Hyperlipidemia, unspecified: Secondary | ICD-10-CM

## 2019-12-15 LAB — CBC
HCT: 39.5 % (ref 36.0–46.0)
Hemoglobin: 13.6 g/dL (ref 12.0–15.0)
MCHC: 34.5 g/dL (ref 30.0–36.0)
MCV: 89.7 fl (ref 78.0–100.0)
Platelets: 251 10*3/uL (ref 150.0–400.0)
RBC: 4.41 Mil/uL (ref 3.87–5.11)
RDW: 13.7 % (ref 11.5–15.5)
WBC: 9.4 10*3/uL (ref 4.0–10.5)

## 2019-12-15 LAB — COMPREHENSIVE METABOLIC PANEL
ALT: 35 U/L (ref 0–35)
AST: 26 U/L (ref 0–37)
Albumin: 4.4 g/dL (ref 3.5–5.2)
Alkaline Phosphatase: 95 U/L (ref 39–117)
BUN: 17 mg/dL (ref 6–23)
CO2: 28 mEq/L (ref 19–32)
Calcium: 9.4 mg/dL (ref 8.4–10.5)
Chloride: 103 mEq/L (ref 96–112)
Creatinine, Ser: 0.6 mg/dL (ref 0.40–1.20)
GFR: 104.13 mL/min (ref >60.00)
Glucose, Bld: 176 mg/dL — ABNORMAL HIGH (ref 70–99)
Potassium: 4.2 mEq/L (ref 3.5–5.1)
Sodium: 141 mEq/L (ref 135–145)
Total Bilirubin: 0.5 mg/dL (ref 0.2–1.2)
Total Protein: 6.8 g/dL (ref 6.0–8.3)

## 2019-12-15 LAB — LIPID PANEL
Cholesterol: 156 mg/dL (ref 0–200)
HDL: 29.6 mg/dL — ABNORMAL LOW (ref >39.00)
NonHDL: 126.18
Total CHOL/HDL Ratio: 5
Triglycerides: 238 mg/dL — ABNORMAL HIGH (ref 0.0–149.0)
VLDL: 47.6 mg/dL — ABNORMAL HIGH (ref 0.0–40.0)

## 2019-12-15 LAB — GLUCOSE, RANDOM: Glucose, Bld: 176 mg/dL — ABNORMAL HIGH (ref 70–99)

## 2019-12-15 LAB — LDL CHOLESTEROL, DIRECT: Direct LDL: 90 mg/dL

## 2019-12-16 NOTE — Progress Notes (Signed)
Please inform patient of the following:  Blood sugar still elevated but better than last time. All of her other labs are stable. Would like for her to schedule an office visit soon to check her A1c and discuss management of her diabetes.  Algis Greenhouse. [name], MD 12/16/2019 8:09 AM

## 2019-12-23 ENCOUNTER — Telehealth: Payer: Self-pay | Admitting: Family Medicine

## 2019-12-23 NOTE — Telephone Encounter (Signed)
Patient states she received a call from someone at our office this morning about 11, and the person she spoke with told her that she is going to have to do another A1c re-check. Patient said a kit could be sent out to her but it would take two weeks, she said she was confused then was told to call our office back.  

## 2019-12-24 NOTE — Telephone Encounter (Signed)
Patient scheduled a office visit

## 2019-12-30 ENCOUNTER — Other Ambulatory Visit: Payer: Self-pay

## 2019-12-30 ENCOUNTER — Encounter: Payer: Self-pay | Admitting: Family Medicine

## 2019-12-30 ENCOUNTER — Ambulatory Visit (INDEPENDENT_AMBULATORY_CARE_PROVIDER_SITE_OTHER): Payer: 59 | Admitting: Family Medicine

## 2019-12-30 VITALS — BP 142/82 | HR 81 | Temp 97.6°F | Ht 68.0 in | Wt 200.6 lb

## 2019-12-30 DIAGNOSIS — E1165 Type 2 diabetes mellitus with hyperglycemia: Secondary | ICD-10-CM

## 2019-12-30 DIAGNOSIS — I152 Hypertension secondary to endocrine disorders: Secondary | ICD-10-CM

## 2019-12-30 DIAGNOSIS — I10 Essential (primary) hypertension: Secondary | ICD-10-CM | POA: Diagnosis not present

## 2019-12-30 DIAGNOSIS — E1159 Type 2 diabetes mellitus with other circulatory complications: Secondary | ICD-10-CM | POA: Diagnosis not present

## 2019-12-30 MED ORDER — OZEMPIC (0.25 OR 0.5 MG/DOSE) 2 MG/1.5ML ~~LOC~~ SOPN: 0.2500 mg | PEN_INJECTOR | SUBCUTANEOUS | 0 refills | Status: DC

## 2019-12-30 NOTE — Assessment & Plan Note (Signed)
A1c 7.4.  She will continue Metformin 1000 mg twice daily.  We will start Ozempic 0.25 mg weekly.  She will check in with me in a few weeks.  Need to recheck A1c in 3 to 6 months.

## 2019-12-30 NOTE — Progress Notes (Signed)
   April Clayton is a 54 y.o. female who presents today for an office visit.  Assessment/Plan:  Chronic Problems Addressed Today: Type 2 diabetes mellitus with hyperglycemia, without long-term current use of insulin (HCC) A1c 7.4.  She will continue Metformin 1000 mg twice daily.  We will start Ozempic 0.25 mg weekly.  She will check in with me in a few weeks.  Need to recheck A1c in 3 to 6 months.  Hypertension associated with diabetes ([place]) Slightly elevated today.  We will continue current dose of valsartan 160 mg daily and metoprolol succinate 25 mg daily.  She will continue monitoring at home.  If persistently elevated next office visit will likely need to increase dose of one of her blood pressure medications.     Subjective:  HPI:  See A/p.         Objective:  Physical Exam: BP (!) 142/82   Pulse 81   Temp 97.6 F (36.4 C) (Temporal)   Ht 5\' 8"  (1.727 m)   Wt 200 lb 9.6 oz (91 kg)   SpO2 98%   BMI 30.50 kg/m   Gen: No acute distress, resting comfortably CV: Regular rate and rhythm with no murmurs appreciated Pulm: Normal work of breathing, clear to auscultation bilaterally with no crackles, wheezes, or rhonchi Neuro: Grossly normal, moves all extremities Psych: Normal affect and thought content      April Clayton M. [name], MD 12/30/2019 2:35 PM

## 2019-12-30 NOTE — Assessment & Plan Note (Signed)
Slightly elevated today.  We will continue current dose of valsartan 160 mg daily and metoprolol succinate 25 mg daily.  She will continue monitoring at home.  If persistently elevated next office visit will likely need to increase dose of one of her blood pressure medications.

## 2019-12-30 NOTE — Patient Instructions (Signed)
It was very nice to see you today!  Your A1c is 7.4.  This is much better than last time.  Please start Ozempic 0.25 mg weekly.  Please let me know if you have any side effects.  Please call [facility].  Please keep an eye on your blood pressure only know if persistently 140/90 or higher.  I will see you back in 3-6 months, please come back to see me sooner.   Take care, Dr [name]  Please try these tips to maintain a healthy lifestyle:   Eat at least 3 REAL meals and 1-2 snacks per day.  Aim for no more than 5 hours between eating.  If you eat breakfast, please do so within one hour of getting up.    Each meal should contain half fruits/vegetables, one quarter protein, and one quarter carbs (no bigger than a computer mouse)   Cut down on sweet beverages. This includes juice, soda, and sweet tea.     Drink at least 1 glass of water with each meal and aim for at least 8 glasses per day   Exercise at least 150 minutes every week.

## 2020-01-01 ENCOUNTER — Encounter: Payer: Self-pay | Admitting: Family Medicine

## 2020-02-06 ENCOUNTER — Encounter: Payer: Self-pay | Admitting: Family Medicine

## 2020-03-02 ENCOUNTER — Encounter: Payer: Self-pay | Admitting: Family Medicine

## 2020-03-05 ENCOUNTER — Other Ambulatory Visit: Payer: Self-pay | Admitting: Family Medicine

## 2020-03-05 DIAGNOSIS — Z1231 Encounter for screening mammogram for malignant neoplasm of breast: Secondary | ICD-10-CM

## 2020-04-01 LAB — HM DIABETES EYE EXAM

## 2020-04-05 ENCOUNTER — Encounter: Payer: Self-pay | Admitting: Family Medicine

## 2020-04-16 ENCOUNTER — Ambulatory Visit: Payer: Self-pay | Admitting: Certified Nurse Midwife

## 2020-04-26 ENCOUNTER — Other Ambulatory Visit: Payer: Self-pay | Admitting: Family Medicine

## 2020-04-26 DIAGNOSIS — Z1231 Encounter for screening mammogram for malignant neoplasm of breast: Secondary | ICD-10-CM

## 2020-04-30 ENCOUNTER — Other Ambulatory Visit: Payer: Self-pay

## 2020-04-30 ENCOUNTER — Ambulatory Visit
Admission: RE | Admit: 2020-04-30 | Discharge: 2020-04-30 | Disposition: A | Discharge status: Home / Self Care | Payer: 59 | Source: Ambulatory Visit

## 2020-04-30 DIAGNOSIS — Z1231 Encounter for screening mammogram for malignant neoplasm of breast: Secondary | ICD-10-CM

## 2020-05-03 ENCOUNTER — Ambulatory Visit (INDEPENDENT_AMBULATORY_CARE_PROVIDER_SITE_OTHER): Payer: 59 | Admitting: Nurse Practitioner

## 2020-05-03 ENCOUNTER — Other Ambulatory Visit: Payer: Self-pay

## 2020-05-03 ENCOUNTER — Other Ambulatory Visit (HOSPITAL_COMMUNITY)
Admission: RE | Admit: 2020-05-03 | Discharge: 2020-05-03 | Disposition: A | Discharge status: Home / Self Care | Payer: 59 | Source: Ambulatory Visit | Attending: Nurse Practitioner | Admitting: Nurse Practitioner

## 2020-05-03 ENCOUNTER — Encounter: Payer: Self-pay | Admitting: Nurse Practitioner

## 2020-05-03 VITALS — BP 118/76 | HR 72 | Resp 16 | Ht 66.75 in | Wt 196.0 lb

## 2020-05-03 DIAGNOSIS — Z01419 Encounter for gynecological examination (general) (routine) without abnormal findings: Secondary | ICD-10-CM | POA: Insufficient documentation

## 2020-05-03 NOTE — Patient Instructions (Signed)
Nice to meet you! Have happy holidays! Health Maintenance for Postmenopausal Women Menopause is a normal process in which your ability to get pregnant comes to an end. This process happens slowly over many months or years, usually between the ages of 48 and 42. Menopause is complete when you have missed your menstrual periods for 12 months. It is important to talk with your health care provider about some of the most common conditions that affect women after menopause (postmenopausal women). These include heart disease, cancer, and bone loss (osteoporosis). Adopting a healthy lifestyle and getting preventive care can help to promote your health and wellness. The actions you take can also lower your chances of developing some of these common conditions. What should I know about menopause? During menopause, you may get a number of symptoms, such as:  Hot flashes. These can be moderate or severe.  Night sweats.  Decrease in sex drive.  Mood swings.  Headaches.  Tiredness.  Irritability.  Memory problems.  Insomnia. Choosing to treat or not to treat these symptoms is a decision that you make with your health care provider. Do I need hormone replacement therapy?  Hormone replacement therapy is effective in treating symptoms that are caused by menopause, such as hot flashes and night sweats.  Hormone replacement carries certain risks, especially as you become older. If you are thinking about using estrogen or estrogen with progestin, discuss the benefits and risks with your health care provider. What is my risk for heart disease and stroke? The risk of heart disease, heart attack, and stroke increases as you age. One of the causes may be a change in the body's hormones during menopause. This can affect how your body uses dietary fats, triglycerides, and cholesterol. Heart attack and stroke are medical emergencies. There are many things that you can do to help prevent heart disease and  stroke. Watch your blood pressure  High blood pressure causes heart disease and increases the risk of stroke. This is more likely to develop in people who have high blood pressure readings, are of African descent, or are overweight.  Have your blood pressure checked: ? Every 3-5 years if you are 26-22 years of age. ? Every year if you are 78 years old or older. Eat a healthy diet   Eat a diet that includes plenty of vegetables, fruits, low-fat dairy products, and lean protein.  Do not eat a lot of foods that are high in solid fats, added sugars, or sodium. Get regular exercise Get regular exercise. This is one of the most important things you can do for your health. Most adults should:  Try to exercise for at least 150 minutes each week. The exercise should increase your heart rate and make you sweat (moderate-intensity exercise).  Try to do strengthening exercises at least twice each week. Do these in addition to the moderate-intensity exercise.  Spend less time sitting. Even light physical activity can be beneficial. Other tips  Work with your health care provider to achieve or maintain a healthy weight.  Do not use any products that contain nicotine or tobacco, such as cigarettes, e-cigarettes, and chewing tobacco. If you need help quitting, ask your health care provider.  Know your numbers. Ask your health care provider to check your cholesterol and your blood sugar (glucose). Continue to have your blood tested as directed by your health care provider. Do I need screening for cancer? Depending on your health history and family history, you may need to have cancer screening  at different stages of your life. This may include screening for:  Breast cancer.  Cervical cancer.  Lung cancer.  Colorectal cancer. What is my risk for osteoporosis? After menopause, you may be at increased risk for osteoporosis. Osteoporosis is a condition in which bone destruction happens more  quickly than new bone creation. To help prevent osteoporosis or the bone fractures that can happen because of osteoporosis, you may take the following actions:  If you are 30-55 years old, get at least 1,000 mg of calcium and at least 600 mg of vitamin D per day.  If you are older than age 30 but younger than age 73, get at least 1,200 mg of calcium and at least 600 mg of vitamin D per day.  If you are older than age 70, get at least 1,200 mg of calcium and at least 800 mg of vitamin D per day. Smoking and drinking excessive alcohol increase the risk of osteoporosis. Eat foods that are rich in calcium and vitamin D, and do weight-bearing exercises several times each week as directed by your health care provider. How does menopause affect my mental health? Depression may occur at any age, but it is more common as you become older. Common symptoms of depression include:  Low or sad mood.  Changes in sleep patterns.  Changes in appetite or eating patterns.  Feeling an overall lack of motivation or enjoyment of activities that you previously enjoyed.  Frequent crying spells. Talk with your health care provider if you think that you are experiencing depression. General instructions See your health care provider for regular wellness exams and vaccines. This may include:  Scheduling regular health, dental, and eye exams.  Getting and maintaining your vaccines. These include: ? Influenza vaccine. Get this vaccine each year before the flu season begins. ? Pneumonia vaccine. ? Shingles vaccine. ? Tetanus, diphtheria, and pertussis (Tdap) booster vaccine. Your health care provider may also recommend other immunizations. Tell your health care provider if you have ever been abused or do not feel safe at home. Summary  Menopause is a normal process in which your ability to get pregnant comes to an end.  This condition causes hot flashes, night sweats, decreased interest in sex, mood swings,  headaches, or lack of sleep.  Treatment for this condition may include hormone replacement therapy.  Take actions to keep yourself healthy, including exercising regularly, eating a healthy diet, watching your weight, and checking your blood pressure and blood sugar levels.  Get screened for cancer and depression. Make sure that you are up to date with all your vaccines. This information is not intended to replace advice given to you by your health care provider. Make sure you discuss any questions you have with your health care provider. Document Revised: 05/08/2018 Document Reviewed: 05/08/2018 Elsevier Patient Education  2020 [employer].

## 2020-05-03 NOTE — Progress Notes (Signed)
54 y.o. G1P0 Married White or Caucasian female here for annual exam.      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 Son is now at [employer]. Will be going back to work soon.  LMP 2018- Went 8 months with out a period then has some spotting, had [place] and was normal. Hot flashes are subsiding but sex drive is very low. Wonders if HRT will help. Discussed.  No LMP recorded. Patient is postmenopausal.  2018        Sexually active: Yes.    The current method of family planning is post menopausal status.    Exercising: Yes.    hiking Smoker:  no  Health Maintenance: Pap: 2018 normal History of abnormal Pap:  Yes, CKC 26 years ago MMG:  04-30-2020 results not in epic yet Colonoscopy: 2019 f/u 2022 BMD:   none TDaP:  2017 Gardasil:   n/a Covid-19: pfizer Pneumonia vaccine(s):  n/a Shingrix:   Not done Hep C testing: not done Screening Labs: with PCP   reports that she has never smoked. She has never used smokeless tobacco. She reports current alcohol use. She reports that she does not use drugs.  Past Medical History:  Diagnosis Date  . Abnormal Pap smear of cervix    in late 20's  . Childhood asthma   . Diabetes mellitus without complication ([place])    type 2  . Hyperlipidemia   . Hypertension   . Shingles     Past Surgical History:  Procedure Laterality Date  . APPENDECTOMY    . cecum removal    . CERVICAL CONE BIOPSY     abnormal pap smear  . CHOLECYSTECTOMY    . TONSILLECTOMY AND ADENOIDECTOMY      Current Outpatient Medications  Medication Sig Dispense Refill  . atorvastatin (LIPITOR) 20 MG tablet TAKE 1 TABLET BY MOUTH EVERY DAY 90 tablet 2  . B Complex Vitamins (B COMPLEX PO) Take by mouth.    Marland Kitchen BIOTIN PO Take 5,000 mcg by mouth.    . cetirizine (ZYRTEC) 10 MG tablet Take 10 mg by mouth daily.    . cholecalciferol (VITAMIN D3) 25 MCG (1000 UNIT) tablet Take 1,000 Units by mouth daily.    . Lactobacillus (ACIDOPHILUS  PROBIOTIC PO) Take by mouth.    . metFORMIN (GLUCOPHAGE) 1000 MG tablet Take 1 tablet (1,000 mg total) by mouth 2 (two) times daily with a meal. 180 tablet 3  . metoprolol succinate (TOPROL-XL) 25 MG 24 hr tablet Take 1 tablet (25 mg total) by mouth daily. 90 tablet 3  . Multiple Vitamins-Minerals (PRESERVISION AREDS PO) Take by mouth.    Marland Kitchen UNABLE TO FIND Multi collagen complex 3 capsules daily    . UNABLE TO FIND amberen 2 capsules daily (hot flashes supplement)    . valsartan (DIOVAN) 160 MG tablet Take 1 tablet (160 mg total) by mouth daily. 90 tablet 3   No current facility-administered medications for this visit.    Family History  Problem Relation Age of Onset  . Colon cancer Mother   . Hypertension Mother   . Colon cancer Brother   . Diabetes Brother   . Hypertension Brother   . Colon cancer Maternal Grandmother   . Hypertension Maternal Grandmother   . Diabetes Paternal Grandmother   . Hypertension Paternal Grandmother   . Stroke Paternal Grandmother   . Hypertension Father   . Heart disease Father   . Colon cancer  Maternal Aunt   . Hypertension Maternal Grandfather   . Hypertension Paternal Grandfather     Review of Systems  Constitutional: Negative.   HENT: Negative.   Eyes: Negative.   Respiratory: Negative.   Cardiovascular: Negative.   Gastrointestinal: Negative.   Endocrine: Negative.   Genitourinary: Negative.   Musculoskeletal: Negative.   Skin: Negative.   Allergic/Immunologic: Negative.   Neurological: Negative.   Hematological: Negative.   Psychiatric/Behavioral: Negative.     Exam:   BP 118/76   Pulse 72   Resp 16   Ht 5' 6.75" (1.695 m)   Wt 196 lb (88.9 kg)   BMI 30.93 kg/m   Height: 5' 6.75" (169.5 cm)  General appearance: alert, cooperative and appears stated age Head: Normocephalic, without obvious abnormality, atraumatic Neck: no adenopathy, supple, symmetrical, trachea midline and thyroid normal to inspection and palpation Lungs:  clear to auscultation bilaterally Breasts: normal appearance, no masses or tenderness Heart: regular rate and rhythm Abdomen: soft, non-tender; bowel sounds normal; no masses,  no organomegaly Extremities: extremities normal, atraumatic, no cyanosis or edema Skin: Skin color, texture, turgor normal. No rashes or lesions Lymph nodes: Cervical, supraclavicular, and axillary nodes normal. No abnormal inguinal nodes palpated Neurologic: Grossly normal   Pelvic: External genitalia:  no lesions              Urethra:  normal appearing urethra with no masses, tenderness or lesions              Bartholins and Skenes: normal                 Vagina: normal appearing vagina with normal color and discharge, no lesions              Cervix: no cervical motion tenderness              Pap taken: Yes.   Bimanual Exam:  Uterus:  normal size, contour, position, consistency, mobility, non-tender              Adnexa: no mass, fullness, tenderness               Rectovaginal: Confirms               Anus:  normal sphincter tone, no lesions  Lovena Le, CMA Chaperone was present for exam.  A:  Well Woman with normal exam  Under care of PCP for HTN, DM  P:   Mammogram recently done, stated normal  pap smear, co-testing collected today, hx CKC >20 years ago  Screening labs with PCP return annually or prn

## 2020-05-05 ENCOUNTER — Encounter: Payer: Self-pay | Admitting: Family Medicine

## 2020-05-05 LAB — CYTOLOGY - PAP
Comment: NEGATIVE
Diagnosis: NEGATIVE
High risk HPV: NEGATIVE

## 2020-05-06 ENCOUNTER — Other Ambulatory Visit: Payer: Self-pay

## 2020-05-06 ENCOUNTER — Other Ambulatory Visit: Payer: Self-pay | Admitting: *Deleted

## 2020-05-06 DIAGNOSIS — Z1159 Encounter for screening for other viral diseases: Secondary | ICD-10-CM

## 2020-05-10 ENCOUNTER — Other Ambulatory Visit: Payer: Self-pay

## 2020-05-10 ENCOUNTER — Encounter: Payer: Self-pay | Admitting: Family Medicine

## 2020-05-10 ENCOUNTER — Ambulatory Visit (INDEPENDENT_AMBULATORY_CARE_PROVIDER_SITE_OTHER): Payer: 59 | Admitting: Family Medicine

## 2020-05-10 VITALS — BP 122/78 | HR 82 | Temp 97.8°F | Ht 66.75 in | Wt 196.4 lb

## 2020-05-10 DIAGNOSIS — I152 Hypertension secondary to endocrine disorders: Secondary | ICD-10-CM | POA: Diagnosis not present

## 2020-05-10 DIAGNOSIS — E1159 Type 2 diabetes mellitus with other circulatory complications: Secondary | ICD-10-CM

## 2020-05-10 DIAGNOSIS — E1165 Type 2 diabetes mellitus with hyperglycemia: Secondary | ICD-10-CM | POA: Diagnosis not present

## 2020-05-10 LAB — POCT GLYCOSYLATED HEMOGLOBIN (HGB A1C): Hemoglobin A1C: 7.3 % — AB (ref 4.0–5.6)

## 2020-05-10 MED ORDER — PIOGLITAZONE HCL 15 MG PO TABS: 15.0000 mg | ORAL_TABLET | Freq: Every day | ORAL | 3 refills | Status: DC

## 2020-05-10 NOTE — Patient Instructions (Signed)
It was very nice to see you today!  Your A1c is 7.3. This is a little bit better than last time. Please keep up the good work with your diet and exercise.  I will see back in April for your annual physical we can maybe consider switching to Ozempic at that time depending on insurance approval.  Take care, Dr [name]  Please try these tips to maintain a healthy lifestyle:   Eat at least 3 REAL meals and 1-2 snacks per day.  Aim for no more than 5 hours between eating.  If you eat breakfast, please do so within one hour of getting up.    Each meal should contain half fruits/vegetables, one quarter protein, and one quarter carbs (no bigger than a computer mouse)   Cut down on sweet beverages. This includes juice, soda, and sweet tea.     Drink at least 1 glass of water with each meal and aim for at least 8 glasses per day   Exercise at least 150 minutes every week.

## 2020-05-10 NOTE — Progress Notes (Signed)
   April Clayton is a 54 y.o. female who presents today for an office visit.  Assessment/Plan:  Chronic Problems Addressed Today: Type 2 diabetes mellitus with hyperglycemia, without long-term current use of insulin (HCC) A1c improved slightly to 7.3.  Continue Metformin 1000 mg twice daily.  Start Actos 15 mg daily.  Discuss potential side effects.  Follow-up in 3 to 4 months for CPE.  Will consider switching Actos to Ozempic depending on insurance approval.  Hypertension associated with diabetes ([place]) At goal.  Continue valsartan 160 mg daily and Toprol succinate 25 mg daily.    Subjective:  HPI:  See A/p.         Objective:  Physical Exam: BP 122/78   Pulse 82   Temp 97.8 F (36.6 C) (Temporal)   Ht 5' 6.75" (1.695 m)   Wt 196 lb 6.4 oz (89.1 kg)   SpO2 98%   BMI 30.99 kg/m   Wt Readings from Last 3 Encounters:  05/10/20 196 lb 6.4 oz (89.1 kg)  05/03/20 196 lb (88.9 kg)  12/30/19 200 lb 9.6 oz (91 kg)    Gen: No acute distress, resting comfortably CV: Regular rate and rhythm with no murmurs appreciated Pulm: Normal work of breathing, clear to auscultation bilaterally with no crackles, wheezes, or rhonchi Neuro: Grossly normal, moves all extremities Psych: Normal affect and thought content      Jessicamarie Amiri M. [name], MD 05/10/2020 11:52 AM

## 2020-05-10 NOTE — Assessment & Plan Note (Signed)
A1c improved slightly to 7.3.  Continue Metformin 1000 mg twice daily.  Start Actos 15 mg daily.  Discuss potential side effects.  Follow-up in 3 to 4 months for CPE.  Will consider switching Actos to Ozempic depending on insurance approval.

## 2020-05-10 NOTE — Assessment & Plan Note (Signed)
At goal.  Continue valsartan 160 mg daily and Toprol succinate 25 mg daily.

## 2020-07-01 ENCOUNTER — Encounter: Payer: Self-pay | Admitting: Family Medicine

## 2020-07-01 ENCOUNTER — Ambulatory Visit (INDEPENDENT_AMBULATORY_CARE_PROVIDER_SITE_OTHER): Payer: BC Managed Care – PPO | Admitting: Family Medicine

## 2020-07-01 ENCOUNTER — Other Ambulatory Visit: Payer: Self-pay

## 2020-07-01 VITALS — BP 106/69 | HR 73 | Temp 98.9°F | Ht 66.75 in | Wt 197.2 lb

## 2020-07-01 DIAGNOSIS — E1159 Type 2 diabetes mellitus with other circulatory complications: Secondary | ICD-10-CM | POA: Diagnosis not present

## 2020-07-01 DIAGNOSIS — E785 Hyperlipidemia, unspecified: Secondary | ICD-10-CM

## 2020-07-01 DIAGNOSIS — Z0001 Encounter for general adult medical examination with abnormal findings: Secondary | ICD-10-CM

## 2020-07-01 DIAGNOSIS — E1169 Type 2 diabetes mellitus with other specified complication: Secondary | ICD-10-CM | POA: Diagnosis not present

## 2020-07-01 DIAGNOSIS — J302 Other seasonal allergic rhinitis: Secondary | ICD-10-CM

## 2020-07-01 DIAGNOSIS — Z800 Family history of malignant neoplasm of digestive organs: Secondary | ICD-10-CM

## 2020-07-01 DIAGNOSIS — I152 Hypertension secondary to endocrine disorders: Secondary | ICD-10-CM

## 2020-07-01 DIAGNOSIS — E1165 Type 2 diabetes mellitus with hyperglycemia: Secondary | ICD-10-CM

## 2020-07-01 DIAGNOSIS — Z1159 Encounter for screening for other viral diseases: Secondary | ICD-10-CM

## 2020-07-01 DIAGNOSIS — Z1211 Encounter for screening for malignant neoplasm of colon: Secondary | ICD-10-CM

## 2020-07-01 NOTE — Assessment & Plan Note (Signed)
Will place referral for colonoscopy.

## 2020-07-01 NOTE — Patient Instructions (Signed)
It was very nice to see you today!  I am glad that things are going well.  Please come back in 2 months to have your blood work done.  Is a good idea for you to get the shingles vaccine.  I will place a referral for your colonoscopy.  I will probably see you back in 3 to 6 months depending on the results of your A1c.  Please come back to see me sooner if needed.  Take care, Dr [name]  Please try these tips to maintain a healthy lifestyle:   Eat at least 3 REAL meals and 1-2 snacks per day.  Aim for no more than 5 hours between eating.  If you eat breakfast, please do so within one hour of getting up.    Each meal should contain half fruits/vegetables, one quarter protein, and one quarter carbs (no bigger than a computer mouse)   Cut down on sweet beverages. This includes juice, soda, and sweet tea.     Drink at least 1 glass of water with each meal and aim for at least 8 glasses per day   Exercise at least 150 minutes every week.    Preventive Care 85-58 Years Old, Female Preventive care refers to lifestyle choices and visits with your health care provider that can promote health and wellness. This includes:  A yearly physical exam. This is also called an annual wellness visit.  Regular dental and eye exams.  Immunizations.  Screening for certain conditions.  Healthy lifestyle choices, such as: ? Eating a healthy diet. ? Getting regular exercise. ? Not using drugs or products that contain nicotine and tobacco. ? Limiting alcohol use. What can I expect for my preventive care visit? Physical exam Your health care provider will check your:  Height and weight. These may be used to calculate your BMI (body mass index). BMI is a measurement that tells if you are at a healthy weight.  Heart rate and blood pressure.  Body temperature.  Skin for abnormal spots. Counseling Your health care provider may ask you questions about your:  Past medical  problems.  Family's medical history.  Alcohol, tobacco, and drug use.  Emotional well-being.  Home life and relationship well-being.  Sexual activity.  Diet, exercise, and sleep habits.  Work and work Statistician.  Access to firearms.  Method of birth control.  Menstrual cycle.  Pregnancy history. What immunizations do I need? Vaccines are usually given at various ages, according to a schedule. Your health care provider will recommend vaccines for you based on your age, medical history, and lifestyle or other factors, such as travel or where you work.   What tests do I need? Blood tests  Lipid and cholesterol levels. These may be checked every 5 years, or more often if you are over 46 years old.  Hepatitis C test.  Hepatitis B test. Screening  Lung cancer screening. You may have this screening every year starting at age 7 if you have a 30-pack-year history of smoking and currently smoke or have quit within the past 15 years.  Colorectal cancer screening. ? All adults should have this screening starting at age 5 and continuing until age 77. ? Your health care provider may recommend screening at age 35 if you are at increased risk. ? You will have tests every 1-10 years, depending on your results and the type of screening test.  Diabetes screening. ? This is done by checking your blood sugar (glucose) after you have not eaten  for a while (fasting). ? You may have this done every 1-3 years.  Mammogram. ? This may be done every 1-2 years. ? Talk with your health care provider about when you should start having regular mammograms. This may depend on whether you have a family history of breast cancer.  BRCA-related cancer screening. This may be done if you have a family history of breast, ovarian, tubal, or peritoneal cancers.  Pelvic exam and Pap test. ? This may be done every 3 years starting at age 49. ? Starting at age 80, this may be done every 5 years if you  have a Pap test in combination with an HPV test. Other tests  STD (sexually transmitted disease) testing, if you are at risk.  Bone density scan. This is done to screen for osteoporosis. You may have this scan if you are at high risk for osteoporosis. Talk with your health care provider about your test results, treatment options, and if necessary, the need for more tests. Follow these instructions at home: Eating and drinking  Eat a diet that includes fresh fruits and vegetables, whole grains, lean protein, and low-fat dairy products.  Take vitamin and mineral supplements as recommended by your health care provider.  Do not drink alcohol if: ? Your health care provider tells you not to drink. ? You are pregnant, may be pregnant, or are planning to become pregnant.  If you drink alcohol: ? Limit how much you have to 0-1 drink a day. ? Be aware of how much alcohol is in your drink. In the [place] one drink equals one 12 oz bottle of beer (355 mL), one 5 oz glass of wine (148 mL), or one 1 oz glass of hard liquor (44 mL).   Lifestyle  Take daily care of your teeth and gums. Brush your teeth every morning and night with fluoride toothpaste. Floss one time each day.  Stay active. Exercise for at least 30 minutes 5 or more days each week.  Do not use any products that contain nicotine or tobacco, such as cigarettes, e-cigarettes, and chewing tobacco. If you need help quitting, ask your health care provider.  Do not use drugs.  If you are sexually active, practice safe sex. Use a condom or other form of protection to prevent STIs (sexually transmitted infections).  If you do not wish to become pregnant, use a form of birth control. If you plan to become pregnant, see your health care provider for a prepregnancy visit.  If told by your health care provider, take low-dose aspirin daily starting at age 1.  Find healthy ways to cope with stress, such as: ? Meditation, yoga, or listening to  music. ? Journaling. ? Talking to a trusted person. ? Spending time with friends and family. Safety  Always wear your seat belt while driving or riding in a vehicle.  Do not drive: ? If you have been drinking alcohol. Do not ride with someone who has been drinking. ? When you are tired or distracted. ? While texting.  Wear a helmet and other protective equipment during sports activities.  If you have firearms in your house, make sure you follow all gun safety procedures. What's next?  Visit your health care provider once a year for an annual wellness visit.  Ask your health care provider how often you should have your eyes and teeth checked.  Stay up to date on all vaccines. This information is not intended to replace advice given to you by your  health care provider. Make sure you discuss any questions you have with your health care provider. Document Revised: 02/17/2020 Document Reviewed: 01/24/2018 Elsevier Patient Education  2021 [employer].

## 2020-07-01 NOTE — Assessment & Plan Note (Signed)
Tolerating Actos well.  She is also on Metformin 1000 mg twice daily.  Will check A1c with her next blood draw.

## 2020-07-01 NOTE — Assessment & Plan Note (Signed)
Stable. Continue zyrtec 10mg daily.  

## 2020-07-01 NOTE — Assessment & Plan Note (Signed)
Stable on Lipitor 10 mg daily.  Will check lipid panel CBC, CMET, TSH.

## 2020-07-01 NOTE — Assessment & Plan Note (Signed)
At goal on valsartan 160 mg daily and Toprol succinate 25 mg daily.  Will check labs soon.

## 2020-07-01 NOTE — Progress Notes (Signed)
Chief Complaint:  April Clayton is a 55 y.o. female who presents today for her annual comprehensive physical exam.    Assessment/Plan:  Chronic Problems Addressed Today: Seasonal allergies Stable. Continue zyrtec 10mg  daily.   Dyslipidemia associated with type 2 diabetes mellitus (HCC) Stable on Lipitor 10 mg daily.  Will check lipid panel CBC, CMET, TSH.  Type 2 diabetes mellitus with hyperglycemia, without long-term current use of insulin (HCC) Tolerating Actos well.  She is also on Metformin 1000 mg twice daily.  Will check A1c with her next blood draw.  Hypertension associated with diabetes ([place]) At goal on valsartan 160 mg daily and Toprol succinate 25 mg daily.  Will check labs soon.  Family history of colon cancer Will place referral for colonoscopy.    Body mass index is 31.12 kg/m. / Obese    Preventative Healthcare: She will come back in a couple months to check labs.  She is up-to-date on mammogram and Pap smear.  Will place referral for colonoscopy.  She will get shingles vaccine soon.  Up-to-date on other vaccines.  Patient Counseling(The following topics were reviewed and/or handout was given):  -Nutrition: Stressed importance of moderation in sodium/caffeine intake, saturated fat and cholesterol, caloric balance, sufficient intake of fresh fruits, vegetables, and fiber.  -Stressed the importance of regular exercise.   -Substance Abuse: Discussed cessation/primary prevention of tobacco, alcohol, or other drug use; driving or other dangerous activities under the influence; availability of treatment for abuse.   -Injury prevention: Discussed safety belts, safety helmets, smoke detector, smoking near bedding or upholstery.   -Sexuality: Discussed sexually transmitted diseases, partner selection, use of condoms, avoidance of unintended pregnancy and contraceptive alternatives.   -Dental health: Discussed importance of regular tooth brushing, flossing, and dental  visits.  -Health maintenance and immunizations reviewed. Please refer to Health maintenance section.  Return to care in 1 year for next preventative visit.     Subjective:  HPI:  She has no acute complaints today.   Lifestyle Diet: Balanced.  Exercise: Likes to walk.   Depression screen PHQ 2/9 05/10/2020  Decreased Interest 0  Down, Depressed, Hopeless 0  PHQ - 2 Score 0   Health Maintenance Due  Topic Date Due  . Hepatitis C Screening  Never done  . FOOT EXAM  Never done  . HIV Screening  Never done    ROS: Per HPI, otherwise a complete review of systems was negative.   PMH:  The following were reviewed and entered/updated in epic: Past Medical History:  Diagnosis Date  . Abnormal Pap smear of cervix    in late 20's  . Childhood asthma   . Diabetes mellitus without complication ([place])    type 2  . Hyperlipidemia   . Hypertension   . Shingles    Patient Active Problem List   Diagnosis Date Noted  . Hypertension associated with diabetes ([place]) 12/02/2018  . Type 2 diabetes mellitus with hyperglycemia, without long-term current use of insulin ([place]) 12/02/2018  . Dyslipidemia associated with type 2 diabetes mellitus ([place]) 12/02/2018  . Seasonal allergies 12/02/2018  . Family history of colon cancer 12/02/2018   Past Surgical History:  Procedure Laterality Date  . APPENDECTOMY    . cecum removal    . CERVICAL CONE BIOPSY     abnormal pap smear  . CHOLECYSTECTOMY    . TONSILLECTOMY AND ADENOIDECTOMY      Family History  Problem Relation Age of Onset  . Colon cancer Mother   . Hypertension  Mother   . Colon cancer Brother   . Diabetes Brother   . Hypertension Brother   . Colon cancer Maternal Grandmother   . Hypertension Maternal Grandmother   . Diabetes Paternal Grandmother   . Hypertension Paternal Grandmother   . Stroke Paternal Grandmother   . Hypertension Father   . Heart disease Father   . Colon cancer Maternal Aunt   . Hypertension Maternal  Grandfather   . Hypertension Paternal Grandfather     Medications- reviewed and updated Current Outpatient Medications  Medication Sig Dispense Refill  . atorvastatin (LIPITOR) 20 MG tablet TAKE 1 TABLET BY MOUTH EVERY DAY 90 tablet 2  . B Complex Vitamins (B COMPLEX PO) Take by mouth.    . cetirizine (ZYRTEC) 10 MG tablet Take 10 mg by mouth daily.    . cholecalciferol (VITAMIN D3) 25 MCG (1000 UNIT) tablet Take 1,000 Units by mouth daily.    . Lactobacillus (ACIDOPHILUS PROBIOTIC PO) Take by mouth.    . metFORMIN (GLUCOPHAGE) 1000 MG tablet Take 1 tablet (1,000 mg total) by mouth 2 (two) times daily with a meal. 180 tablet 3  . metoprolol succinate (TOPROL-XL) 25 MG 24 hr tablet Take 1 tablet (25 mg total) by mouth daily. 90 tablet 3  . Multiple Vitamins-Minerals (PRESERVISION AREDS PO) Take by mouth.    . pioglitazone (ACTOS) 15 MG tablet Take 1 tablet (15 mg total) by mouth daily. 90 tablet 3  . UNABLE TO FIND Multi collagen complex 3 capsules daily    . valsartan (DIOVAN) 160 MG tablet Take 1 tablet (160 mg total) by mouth daily. 90 tablet 3   No current facility-administered medications for this visit.    Allergies-reviewed and updated Allergies  Allergen Reactions  . Nonoxynol-9     Social History   Socioeconomic History  . Marital status: Married    Spouse name: Not on file  . Number of children: Not on file  . Years of education: Not on file  . Highest education level: Not on file  Occupational History  . Not on file  Tobacco Use  . Smoking status: Never Smoker  . Smokeless tobacco: Never Used  Substance and Sexual Activity  . Alcohol use: Yes    Comment: once q couple of weeks  . Drug use: Never  . Sexual activity: Yes    Partners: Male    Birth control/protection: Post-menopausal  Other Topics Concern  . Not on file  Social History Narrative  . Not on file   Social Determinants of Health   Financial Resource Strain: Not on file  Food Insecurity: Not  on file  Transportation Needs: Not on file  Physical Activity: Not on file  Stress: Not on file  Social Connections: Not on file        Objective:  Physical Exam: BP 106/69   Pulse 73   Temp 98.9 F (37.2 C) (Temporal)   Ht 5' 6.75" (1.695 m)   Wt 197 lb 3.2 oz (89.4 kg)   SpO2 98%   BMI 31.12 kg/m   Body mass index is 31.12 kg/m. Wt Readings from Last 3 Encounters:  07/01/20 197 lb 3.2 oz (89.4 kg)  05/10/20 196 lb 6.4 oz (89.1 kg)  05/03/20 196 lb (88.9 kg)   Gen: NAD, resting comfortably HEENT: TMs normal bilaterally. OP clear. No thyromegaly noted.  CV: RRR with no murmurs appreciated Pulm: NWOB, CTAB with no crackles, wheezes, or rhonchi GI: Normal bowel sounds present. Soft, Nontender, Nondistended. MSK: no  edema, cyanosis, or clubbing noted Skin: warm, dry Neuro: CN2-12 grossly intact. Strength 5/5 in upper and lower extremities. Reflexes symmetric and intact bilaterally.  Psych: Normal affect and thought content     Leslie Langille M. [name], MD 07/01/2020 9:50 AM

## 2020-07-26 ENCOUNTER — Telehealth: Payer: Self-pay

## 2020-07-26 NOTE — Telephone Encounter (Signed)
Hey Dr Hilarie Fredrickson, this pt is being referred to [place] for a colonoscopy from [facility] but it looks like the pt had a colonoscopy done on 2019, I will send the report to you for review, please advise on scheduling.

## 2020-08-06 NOTE — Telephone Encounter (Signed)
Per Dr Hilarie Fredrickson ok to schedule recall colon now, left msg on pt's vm to call back  Will place record in referral cabinet

## 2020-08-16 ENCOUNTER — Other Ambulatory Visit: Payer: Self-pay | Admitting: Family Medicine

## 2020-08-16 DIAGNOSIS — I152 Hypertension secondary to endocrine disorders: Secondary | ICD-10-CM

## 2020-08-16 DIAGNOSIS — E1159 Type 2 diabetes mellitus with other circulatory complications: Secondary | ICD-10-CM

## 2020-08-30 ENCOUNTER — Other Ambulatory Visit: Payer: Self-pay

## 2020-08-30 ENCOUNTER — Other Ambulatory Visit (INDEPENDENT_AMBULATORY_CARE_PROVIDER_SITE_OTHER): Payer: BC Managed Care – PPO

## 2020-08-30 DIAGNOSIS — E1169 Type 2 diabetes mellitus with other specified complication: Secondary | ICD-10-CM

## 2020-08-30 DIAGNOSIS — E785 Hyperlipidemia, unspecified: Secondary | ICD-10-CM | POA: Diagnosis not present

## 2020-08-30 DIAGNOSIS — Z1159 Encounter for screening for other viral diseases: Secondary | ICD-10-CM | POA: Diagnosis not present

## 2020-08-30 DIAGNOSIS — E1165 Type 2 diabetes mellitus with hyperglycemia: Secondary | ICD-10-CM

## 2020-08-30 LAB — COMPREHENSIVE METABOLIC PANEL
ALT: 33 U/L (ref 0–35)
AST: 25 U/L (ref 0–37)
Albumin: 4.3 g/dL (ref 3.5–5.2)
Alkaline Phosphatase: 85 U/L (ref 39–117)
BUN: 11 mg/dL (ref 6–23)
CO2: 28 mEq/L (ref 19–32)
Calcium: 9.5 mg/dL (ref 8.4–10.5)
Chloride: 104 mEq/L (ref 96–112)
Creatinine, Ser: 0.61 mg/dL (ref 0.40–1.20)
GFR: 101.05 mL/min (ref >60.00)
Glucose, Bld: 137 mg/dL — ABNORMAL HIGH (ref 70–99)
Potassium: 4.6 mEq/L (ref 3.5–5.1)
Sodium: 142 mEq/L (ref 135–145)
Total Bilirubin: 0.3 mg/dL (ref 0.2–1.2)
Total Protein: 6.7 g/dL (ref 6.0–8.3)

## 2020-08-30 LAB — CBC
HCT: 39.7 % (ref 36.0–46.0)
Hemoglobin: 13.3 g/dL (ref 12.0–15.0)
MCHC: 33.5 g/dL (ref 30.0–36.0)
MCV: 90.9 fl (ref 78.0–100.0)
Platelets: 257 10*3/uL (ref 150.0–400.0)
RBC: 4.37 Mil/uL (ref 3.87–5.11)
RDW: 13.8 % (ref 11.5–15.5)
WBC: 6.4 10*3/uL (ref 4.0–10.5)

## 2020-08-30 LAB — LIPID PANEL
Cholesterol: 149 mg/dL (ref 0–200)
HDL: 38.3 mg/dL — ABNORMAL LOW (ref >39.00)
LDL Cholesterol: 85 mg/dL (ref 0–99)
NonHDL: 110.52
Total CHOL/HDL Ratio: 4
Triglycerides: 129 mg/dL (ref 0.0–149.0)
VLDL: 25.8 mg/dL (ref 0.0–40.0)

## 2020-08-30 LAB — TSH: TSH: 3.29 u[IU]/mL (ref 0.35–4.50)

## 2020-08-30 LAB — HEMOGLOBIN A1C: Hgb A1c MFr Bld: 6.6 % — ABNORMAL HIGH (ref 4.6–6.5)

## 2020-08-31 LAB — HEPATITIS C ANTIBODY
Hepatitis C Ab: NONREACTIVE
SIGNAL TO CUT-OFF: 0 (ref <1.00)

## 2020-08-31 NOTE — Progress Notes (Signed)
Please inform patient of the following:  Blood sugar and cholesterol are better than before. All of her other labs are stable. Do not need to make any changes to her treatment plan. We can recheck her A1c again in 3-6 months.  Algis Greenhouse. [name], MD 08/31/2020 12:50 PM

## 2020-09-16 ENCOUNTER — Encounter: Payer: Self-pay | Admitting: Internal Medicine

## 2020-09-16 NOTE — Telephone Encounter (Signed)
Patient called back and scheduled for 11/18/20 and 12/02/20.

## 2020-09-17 ENCOUNTER — Other Ambulatory Visit: Payer: Self-pay | Admitting: Family Medicine

## 2020-09-24 ENCOUNTER — Encounter: Payer: Self-pay | Admitting: Family Medicine

## 2020-09-24 ENCOUNTER — Other Ambulatory Visit: Payer: Self-pay

## 2020-09-24 MED ORDER — METFORMIN HCL ER (OSM) 1000 MG PO TB24: 1000.0000 mg | ORAL_TABLET | Freq: Two times a day (BID) | ORAL | 1 refills | Status: DC

## 2020-09-24 NOTE — Telephone Encounter (Signed)
Patient notified rx sent in.

## 2020-09-24 NOTE — Telephone Encounter (Signed)
Left voice message for patient to call clinic.  

## 2020-09-27 ENCOUNTER — Other Ambulatory Visit: Payer: Self-pay | Admitting: Family Medicine

## 2020-09-27 NOTE — Telephone Encounter (Signed)
Pt called regarding this medication. Please advise.  °

## 2020-11-18 ENCOUNTER — Ambulatory Visit (AMBULATORY_SURGERY_CENTER): Payer: BC Managed Care – PPO

## 2020-11-18 VITALS — Ht 67.5 in | Wt 185.0 lb

## 2020-11-18 DIAGNOSIS — Z86010 Personal history of colonic polyps: Secondary | ICD-10-CM

## 2020-11-18 DIAGNOSIS — Z800 Family history of malignant neoplasm of digestive organs: Secondary | ICD-10-CM

## 2020-11-18 MED ORDER — SUTAB 1479-225-188 MG PO TABS: 12.0000 | ORAL_TABLET | ORAL | 0 refills | Status: DC

## 2020-11-18 NOTE — Progress Notes (Signed)
No egg or soy allergy known to patient  No issues with past sedation with any surgeries or procedures Patient denies ever being told they had issues or difficulty with intubation  No FH of Malignant Hyperthermia No diet pills per patient No home 02 use per patient  No blood thinners per patient  Pt denies issues with constipation  No A fib or A flutter  EMMI video to pt or via [place] 19 guidelines implemented in PV today with Pt and RN  Pt is fully vaccinated  for [employer] coupon given   NO PA's for preps discussed with pt In PV today  Discussed with pt there will be an out-of-pocket cost for prep and that varies from $0 to 70 dollars   Due to the COVID-19 pandemic we are asking patients to follow certain guidelines.  Pt aware of COVID protocols and [facility] guidelines

## 2020-12-01 ENCOUNTER — Encounter: Payer: Self-pay | Admitting: Internal Medicine

## 2020-12-02 ENCOUNTER — Ambulatory Visit (AMBULATORY_SURGERY_CENTER): Payer: BC Managed Care – PPO | Admitting: Internal Medicine

## 2020-12-02 ENCOUNTER — Encounter: Payer: Self-pay | Admitting: Internal Medicine

## 2020-12-02 ENCOUNTER — Other Ambulatory Visit: Payer: Self-pay

## 2020-12-02 ENCOUNTER — Other Ambulatory Visit: Payer: Self-pay | Admitting: Family Medicine

## 2020-12-02 VITALS — BP 121/82 | HR 63 | Temp 98.0°F | Resp 11 | Ht 67.5 in | Wt 185.0 lb

## 2020-12-02 DIAGNOSIS — D122 Benign neoplasm of ascending colon: Secondary | ICD-10-CM

## 2020-12-02 DIAGNOSIS — Z1211 Encounter for screening for malignant neoplasm of colon: Secondary | ICD-10-CM

## 2020-12-02 DIAGNOSIS — K621 Rectal polyp: Secondary | ICD-10-CM

## 2020-12-02 DIAGNOSIS — D126 Benign neoplasm of colon, unspecified: Secondary | ICD-10-CM | POA: Diagnosis not present

## 2020-12-02 DIAGNOSIS — D120 Benign neoplasm of cecum: Secondary | ICD-10-CM

## 2020-12-02 DIAGNOSIS — Z800 Family history of malignant neoplasm of digestive organs: Secondary | ICD-10-CM

## 2020-12-02 DIAGNOSIS — D123 Benign neoplasm of transverse colon: Secondary | ICD-10-CM | POA: Diagnosis not present

## 2020-12-02 DIAGNOSIS — Z860100 Personal history of colon polyps, unspecified: Secondary | ICD-10-CM

## 2020-12-02 DIAGNOSIS — D374 Neoplasm of uncertain behavior of colon: Secondary | ICD-10-CM

## 2020-12-02 DIAGNOSIS — K635 Polyp of colon: Secondary | ICD-10-CM

## 2020-12-02 DIAGNOSIS — D125 Benign neoplasm of sigmoid colon: Secondary | ICD-10-CM

## 2020-12-02 DIAGNOSIS — D127 Benign neoplasm of rectosigmoid junction: Secondary | ICD-10-CM | POA: Diagnosis not present

## 2020-12-02 DIAGNOSIS — D128 Benign neoplasm of rectum: Secondary | ICD-10-CM

## 2020-12-02 MED ORDER — SODIUM CHLORIDE 0.9 % IV SOLN: 500.0000 mL | Freq: Once | INTRAVENOUS | Status: DC

## 2020-12-02 NOTE — Op Note (Signed)
[facility] [facility] Patient Name: April Clayton Procedure Date: 12/02/2020 10:04 AM MRN: [id number] Endoscopist: Jerene Bears , MD Age: 55 Referring MD:  Date of Birth: 1965/06/06 Gender: Female Account #: [id number] Procedure:                Colonoscopy Indications:              High risk colon cancer surveillance: Personal                            history of multiple adenomas and sessile serrated                            polyps including 1 SSP requiring extended                            appendectomy, Family history of colon cancer in                            multiple first-degree relatives Medicines:                Monitored Anesthesia Care Procedure:                Pre-Anesthesia Assessment:                           - Prior to the procedure, a History and Physical                            was performed, and patient medications and                            allergies were reviewed. The patient's tolerance of                            previous anesthesia was also reviewed. The risks                            and benefits of the procedure and the sedation                            options and risks were discussed with the patient.                            All questions were answered, and informed consent                            was obtained. Prior Anticoagulants: The patient has                            taken no previous anticoagulant or antiplatelet                            agents. ASA Grade Assessment: II - A patient with  mild systemic disease. After reviewing the risks                            and benefits, the patient was deemed in                            satisfactory condition to undergo the procedure.                           After obtaining informed consent, the colonoscope                            was passed under direct vision. Throughout the                            procedure, the patient's blood pressure,  pulse, and                            oxygen saturations were monitored continuously. The                            Olympus PFC-[id number]) Colonoscope was                            introduced through the anus and advanced to the                            cecum, identified by appendiceal orifice and                            ileocecal valve. The colonoscopy was performed                            without difficulty. The patient tolerated the                            procedure well. The quality of the bowel                            preparation was good. The ileocecal valve,                            appendiceal orifice, and rectum were photographed. Scope In: 10:18:06 AM Scope Out: 54:27:06 AM Scope Withdrawal Time: 0 hours 24 minutes 28 seconds  Total Procedure Duration: 0 hours 28 minutes 7 seconds  Findings:                 The digital rectal exam was normal.                           Two sessile polyps were found in the cecum. The                            polyps were 1 to 5 mm in size. These polyps were  removed with a cold snare. Resection and retrieval                            were complete.                           A 18 mm polyp was found in the ascending colon. The                            polyp was sessile. The polyp was removed with a                            cold snare. Resection and retrieval were complete.                           Two sessile polyps were found in the hepatic                            flexure. The polyps were 4 to 5 mm in size. These                            polyps were removed with a cold snare. Resection                            and retrieval were complete.                           A 5 mm polyp was found in the transverse colon. The                            polyp was sessile. The polyp was removed with a                            cold snare. Resection and retrieval were complete.                            A 5 mm polyp was found in the sigmoid colon. The                            polyp was sessile. The polyp was removed with a                            cold snare. Resection and retrieval were complete.                           A 4 mm polyp was found in the rectum. The polyp was                            sessile. The polyp was removed with a cold snare.                            Resection and  retrieval were complete.                           A few small-mouthed diverticula were found in the                            sigmoid colon.                           Internal hemorrhoids were found during                            retroflexion. The hemorrhoids were small. Complications:            No immediate complications. Estimated Blood Loss:     Estimated blood loss was minimal. Impression:               - Two 1 to 5 mm polyps in the cecum, removed with a                            cold snare. Resected and retrieved.                           - One 18 mm polyp in the ascending colon, removed                            with a cold snare. Resected and retrieved.                           - Two 4 to 5 mm polyps at the hepatic flexure,                            removed with a cold snare. Resected and retrieved.                           - One 5 mm polyp in the transverse colon, removed                            with a cold snare. Resected and retrieved.                           - One 5 mm polyp in the sigmoid colon, removed with                            a cold snare. Resected and retrieved.                           - One 4 mm polyp in the rectum, removed with a cold                            snare. Resected and retrieved.                           - Diverticulosis in the sigmoid colon.                           -  Small internal hemorrhoids. Recommendation:           - Patient has a contact number available for                            emergencies. The signs and symptoms of  potential                            delayed complications were discussed with the                            patient. Return to normal activities tomorrow.                            Written discharge instructions were provided to the                            patient.                           - Resume previous diet.                           - Continue present medications.                           - Await pathology results.                           - Referral to medical genetics for evaluation of                            Lynch syndrome in the setting of personal history                            of multiple colonic polyps and multiple 1st and 2nd                            degree relatives with CRC.                           - Repeat colonoscopy is recommended for                            surveillance. The colonoscopy date will be                            determined after pathology results from today's                            exam become available for review. Jerene Bears, MD 12/02/2020 10:52:05 AM This report has been signed electronically.

## 2020-12-02 NOTE — Patient Instructions (Addendum)
Handouts on polyps, hemorrhoids and banding, and diverticulosis given to you today Await pathology results   YOU HAD AN ENDOSCOPIC PROCEDURE TODAY AT [place]:   Refer to the procedure report that was given to you for any specific questions about what was found during the examination.  If the procedure report does not answer your questions, please call your gastroenterologist to clarify.  If you requested that your care partner not be given the details of your procedure findings, then the procedure report has been included in a sealed envelope for you to review at your convenience later.  YOU SHOULD EXPECT: Some feelings of bloating in the abdomen. Passage of more gas than usual.  Walking can help get rid of the air that was put into your GI tract during the procedure and reduce the bloating. If you had a lower endoscopy (such as a colonoscopy or flexible sigmoidoscopy) you may notice spotting of blood in your stool or on the toilet paper. If you underwent a bowel prep for your procedure, you may not have a normal bowel movement for a few days.  Please Note:  You might notice some irritation and congestion in your nose or some drainage.  This is from the oxygen used during your procedure.  There is no need for concern and it should clear up in a day or so.  SYMPTOMS TO REPORT IMMEDIATELY:  Following lower endoscopy (colonoscopy or flexible sigmoidoscopy):  Excessive amounts of blood in the stool  Significant tenderness or worsening of abdominal pains  Swelling of the abdomen that is new, acute  Fever of 100F or higher   For urgent or emergent issues, a gastroenterologist can be reached at any hour by calling [phone]. Do not use MyChart messaging for urgent concerns.    DIET:  We do recommend a small meal at first, but then you may proceed to your regular diet.  Drink plenty of fluids but you should avoid alcoholic beverages for 24 hours.  ACTIVITY:  You should plan  to take it easy for the rest of today and you should NOT DRIVE or use heavy machinery until tomorrow (because of the sedation medicines used during the test).    FOLLOW UP: Our staff will call the number listed on your records 48-72 hours following your procedure to check on you and address any questions or concerns that you may have regarding the information given to you following your procedure. If we do not reach you, we will leave a message.  We will attempt to reach you two times.  During this call, we will ask if you have developed any symptoms of COVID 19. If you develop any symptoms (ie: fever, flu-like symptoms, shortness of breath, cough etc.) before then, please call [phone].  If you test positive for Covid 19 in the 2 weeks post procedure, please call and report this information to [place].    If any biopsies were taken you will be contacted by phone or by letter within the next 1-3 weeks.  Please call us at [phone] if you have not heard about the biopsies in 3 weeks.    SIGNATURES/CONFIDENTIALITY: You and/or your care partner have signed paperwork which will be entered into your electronic medical record.  These signatures attest to the fact that that the information above on your After Visit Summary has been reviewed and is understood.  Full responsibility of the confidentiality of this discharge information lies with you and/or your care-partner.

## 2020-12-02 NOTE — Progress Notes (Signed)
Called to room to assist during endoscopic procedure.  Patient ID and intended procedure confirmed with present staff. Received instructions for my participation in the procedure from the performing physician.  

## 2020-12-02 NOTE — Progress Notes (Signed)
Report to PACU, RN, vss, BBS= Clear.  

## 2020-12-02 NOTE — Progress Notes (Signed)
Pt's states no medical or surgical changes since previsit or office visit. 

## 2020-12-03 ENCOUNTER — Other Ambulatory Visit: Payer: Self-pay

## 2020-12-03 DIAGNOSIS — Z86010 Personal history of colonic polyps: Secondary | ICD-10-CM

## 2020-12-03 DIAGNOSIS — Z800 Family history of malignant neoplasm of digestive organs: Secondary | ICD-10-CM

## 2020-12-06 ENCOUNTER — Telehealth: Payer: Self-pay

## 2020-12-06 NOTE — Telephone Encounter (Signed)
  Follow up Call-  Call back number 12/02/2020  Post procedure Call Back phone  # [phone]  Permission to leave phone message Yes     Patient questions:  Do you have a fever, pain , or abdominal swelling? No. Pain Score  0 *  Have you tolerated food without any problems? Yes.    Have you been able to return to your normal activities? Yes.    Do you have any questions about your discharge instructions: Diet   No. Medications  No. Follow up visit  No.  Do you have questions or concerns about your Care? No.  Actions: * If pain score is 4 or above: No action needed, pain <4.  Have you developed a fever since your procedure? no  2.   Have you had an respiratory symptoms (SOB or cough) since your procedure? no  3.   Have you tested positive for COVID 19 since your procedure no  4.   Have you had any family members/close contacts diagnosed with the COVID 19 since your procedure?  no   If yes to any of these questions please route to Joylene John, RN and Joella Prince, RN

## 2020-12-07 ENCOUNTER — Telehealth: Payer: Self-pay | Admitting: Genetic Counselor

## 2020-12-07 NOTE — Telephone Encounter (Signed)
Received a genetic counseling referral from Dr. Hilarie Fredrickson for a personal hx of colon polyps. April Clayton returned my call and has been scheduled to see Cari on 7/25 at 1pm.

## 2020-12-08 ENCOUNTER — Encounter: Payer: Self-pay | Admitting: Internal Medicine

## 2020-12-20 ENCOUNTER — Encounter: Payer: Self-pay | Admitting: Genetic Counselor

## 2020-12-20 ENCOUNTER — Inpatient Hospital Stay: Payer: BC Managed Care – PPO | Attending: Genetic Counselor | Admitting: Genetic Counselor

## 2020-12-20 ENCOUNTER — Inpatient Hospital Stay: Payer: BC Managed Care – PPO

## 2020-12-20 ENCOUNTER — Other Ambulatory Visit: Payer: Self-pay

## 2020-12-20 DIAGNOSIS — Z800 Family history of malignant neoplasm of digestive organs: Secondary | ICD-10-CM

## 2020-12-20 DIAGNOSIS — Z8371 Family history of colonic polyps: Secondary | ICD-10-CM

## 2020-12-20 DIAGNOSIS — D126 Benign neoplasm of colon, unspecified: Secondary | ICD-10-CM

## 2020-12-20 DIAGNOSIS — K635 Polyp of colon: Secondary | ICD-10-CM | POA: Insufficient documentation

## 2020-12-20 MED ORDER — FUROSEMIDE 10 MG/ML IJ SOLN
INTRAMUSCULAR | Status: AC
  Filled 2020-12-20: qty 2

## 2020-12-20 NOTE — Progress Notes (Signed)
REFERRING PROVIDER: Vivi Barrack, MD [address] [place],  [place] [address]  PRIMARY PROVIDER:  Vivi Barrack, MD  PRIMARY REASON FOR VISIT:  1. Family history of malignant neoplasm of gastrointestinal tract   2. Family history of polyps in the colon   3. Adenomatous polyp of colon, unspecified part of colon      HISTORY OF PRESENT ILLNESS:   April Clayton, a 55 y.o. female, was seen for a [place] cancer genetics consultation at the request of Dr. [name] due to a personal and family history of polyposis and family history of cancer.  April Clayton presents to clinic today to discuss the possibility of a hereditary predisposition to cancer, genetic testing, and to further clarify her future cancer risks, as well as potential cancer risks for family members.   April Clayton is a 55 y.o. female with no personal history of cancer.  She reports having colonoscopies since she was 71, with her first colonoscopy finding 9 polyps.  She estimates having between 40-50 polyps total.  CANCER HISTORY:  Oncology History   No history exists.     RISK FACTORS:  Menarche was at age 58.  First live birth at age 13.  OCP use for approximately  14  years.  Ovaries intact: yes.  Hysterectomy: no.  Menopausal status: postmenopausal.  HRT use: 0 years. Colonoscopy: yes;  polyposis . Mammogram within the last year: yes. Number of breast biopsies: 0. Up to date with pelvic exams: yes. Any excessive radiation exposure in the past: no  Past Medical History:  Diagnosis Date   Abnormal Pap smear of cervix    in late 20's   Childhood asthma    Colon polyps    Diabetes mellitus without complication ([place])    type 2   Family history of malignant neoplasm of gastrointestinal tract    Family history of polyps in the colon    Hyperlipidemia    Hypertension    Shingles     Past Surgical History:  Procedure Laterality Date   APPENDECTOMY     cecum removal     CERVICAL CONE BIOPSY      abnormal pap smear   CHOLECYSTECTOMY     COLONOSCOPY     2014 and 2019 and numerous over the years due to [place] History   Socioeconomic History   Marital status: Married    Spouse name: Not on file   Number of children: Not on file   Years of education: Not on file   Highest education level: Not on file  Occupational History   Not on file  Tobacco Use   Smoking status: Never   Smokeless tobacco: Never  Vaping Use   Vaping Use: Never used  Substance and Sexual Activity   Alcohol use: Yes    Comment: occasionally , less than monthly   Drug use: Never   Sexual activity: Yes    Partners: Male    Birth control/protection: Post-menopausal  Other Topics Concern   Not on file  Social History Narrative   Not on file   Social Determinants of Health   Financial Resource Strain: Not on file  Food Insecurity: Not on file  Transportation Needs: Not on file  Physical Activity: Not on file  Stress: Not on file  Social Connections: Not on file     FAMILY HISTORY:  We obtained a detailed, 4-generation family history.  Significant diagnoses are listed below:  Family History  Problem Relation Age of Onset   Hypertension Mother    Colon polyps Mother    Hypertension Father    Heart disease Father    Diabetes Brother    Hypertension Brother    Colon cancer Maternal Uncle 8   Colon cancer Maternal Grandmother 20       d. 67   Hypertension Maternal Grandmother    Hypertension Maternal Grandfather    Diabetes Paternal Grandmother    Hypertension Paternal Grandmother    Stroke Paternal Grandmother    Hypertension Paternal Grandfather    Colon cancer Other 53       MGMs sister   Breast cancer Other 75       MGMs sister    The patient has one son who is cancer free.  She has a brother who is cancer free. Both parents are living.  The patient's father is 28 and cancer free.  He has two brothers who are deceased and did not have  cancer.  His parents are deceased.  The patient's mother has polyposis with between 26-80 polyps in her lifetime.  She has a brother who had colon cancer at 72 and prostate cancer at 40.  Her parents are deceased.  Her father had stomach cancer and mother had colon cancer in her 80's. Her mother had two sisters, one who had colon cancer in her 49's and died and the other who had breast cancer in her 41's.  April Clayton is unaware of previous family history of genetic testing for hereditary cancer risks. Patient's maternal ancestors are of Caucasian descent, and paternal ancestors are of Caucasian descent. There is no reported Ashkenazi Jewish ancestry. There is no known consanguinity.  GENETIC COUNSELING ASSESSMENT: April Clayton is a 55 y.o. female with a personal and family history of polyposis and family history of polyps and cancer which is somewhat suggestive of a hereditary cancer syndrome such as attenuated APC or other polyposis syndrome and predisposition to cancer given the . We, therefore, discussed and recommended the following at today's visit.   DISCUSSION: We discussed that 5 - 7% of colon cancer is hereditary, with most cases associated with Lynch syndrome.  Most cases of Lynch syndrome do not have polyposis.  Many people have polyps, but when someone has more than 10 polyps there is a risk for a polyposis syndrome that can lead to colon cancer.  There are other genes that can be associated with hereditary colon and polyposis cancer syndromes.  These include APC, MUTYH and BMPR1A.  We discussed that testing is beneficial for several reasons including knowing how to follow individuals and understand if other family members could be at risk for cancer and allow them to undergo genetic testing.   We reviewed the characteristics, features and inheritance patterns of hereditary cancer syndromes. We also discussed genetic testing, including the appropriate family members to test, the process of  testing, insurance coverage and turn-around-time for results. We discussed the implications of a negative, positive, carrier and/or variant of uncertain significant result. We recommended April Clayton pursue genetic testing for the CancerNext-Expanded+RNAinsight gene panel. The CancerNext-Expanded gene panel offered by [facility] and includes sequencing and rearrangement analysis for the following 77 genes: AIP, ALK, APC*, ATM*, AXIN2, BAP1, BARD1, BLM, BMPR1A, BRCA1*, BRCA2*, BRIP1*, CDC73, CDH1*, CDK4, CDKN1B, CDKN2A, CHEK2*, CTNNA1, DICER1, FANCC, FH, FLCN, GALNT12, KIF1B, LZTR1, MAX, MEN1, MET, MLH1*, MSH2*, MSH3, MSH6*, MUTYH*, NBN, NF1*, NF2, NTHL1, PALB2*, PHOX2B, PMS2*, POT1, PRKAR1A, PTCH1, PTEN*, RAD51C*, RAD51D*,  RB1, RECQL, RET, SDHA, SDHAF2, SDHB, SDHC, SDHD, SMAD4, SMARCA4, SMARCB1, SMARCE1, STK11, SUFU, TMEM127, TP53*, TSC1, TSC2, VHL and XRCC2 (sequencing and deletion/duplication); EGFR, EGLN1, HOXB13, KIT, MITF, PDGFRA, POLD1, and POLE (sequencing only); EPCAM and GREM1 (deletion/duplication only). DNA and RNA analyses performed for * genes.   Based on April Clayton's personal and family history of polyposis and family history of cancer, she meets medical criteria for genetic testing. Despite that she meets criteria, she may still have an out of pocket cost. We discussed that if her out of pocket cost for testing is over $100, the laboratory will call and confirm whether she wants to proceed with testing.  If the out of pocket cost of testing is less than $100 she will be billed by the genetic testing laboratory.   PLAN: After considering the risks, benefits, and limitations, April Clayton provided informed consent to pursue genetic testing and the blood sample was sent to [facility] for analysis of the CancerNext-Expanded+RNAinsight. Results should be available within approximately 2-3 weeks' time, at which point they will be disclosed by telephone to April Clayton, as will any  additional recommendations warranted by these results. April Clayton will receive a summary of her genetic counseling visit and a copy of her results once available. This information will also be available in Epic.   Lastly, we encouraged April Clayton to remain in contact with cancer genetics annually so that we can continuously update the family history and inform her of any changes in cancer genetics and testing that may be of benefit for this family.   April Clayton's questions were answered to her satisfaction today. Our contact information was provided should additional questions or concerns arise. Thank you for the referral and allowing [place] to share in the care of your patient.   Laveda Demedeiros P. Florene Clayton, [place], [facility] Licensed, Insurance risk surveyor [email or web address] phone: [phone]  The patient was seen for a total of 40 minutes in face-to-face genetic counseling.  The patient was seen alone.  This patient was discussed with Drs. Magrinat, Lindi Adie and/or Burr Medico who agrees with the above.    _______________________________________________________________________ For Office Staff:  Number of people involved in session: 1 Was an Intern/ student involved with case: yes

## 2021-01-06 ENCOUNTER — Ambulatory Visit: Payer: Self-pay | Admitting: Genetic Counselor

## 2021-01-06 ENCOUNTER — Telehealth: Payer: Self-pay | Admitting: Genetic Counselor

## 2021-01-06 DIAGNOSIS — D126 Benign neoplasm of colon, unspecified: Secondary | ICD-10-CM

## 2021-01-06 DIAGNOSIS — Z1379 Encounter for other screening for genetic and chromosomal anomalies: Secondary | ICD-10-CM

## 2021-01-06 NOTE — Progress Notes (Signed)
HPI:  Ms. Keeling was previously seen in the [facility] due to a personal and family history of polyposis and family history of colon cancer and concerns regarding a hereditary predisposition to cancer. Please refer to our prior cancer genetics clinic note for more information regarding our discussion, assessment and recommendations, at the time. Ms. Bona's recent genetic test results were disclosed to her, as were recommendations warranted by these results. These results and recommendations are discussed in more detail below.  CANCER HISTORY:  Oncology History   No history exists.    FAMILY HISTORY:  We obtained a detailed, 4-generation family history.  Significant diagnoses are listed below: Family History  Problem Relation Age of Onset   Hypertension Mother    Colon polyps Mother    Hypertension Father    Heart disease Father    Diabetes Brother    Hypertension Brother    Colon cancer Maternal Uncle 40   Colon cancer Maternal Grandmother 20       d. 70   Hypertension Maternal Grandmother    Hypertension Maternal Grandfather    Diabetes Paternal Grandmother    Hypertension Paternal Grandmother    Stroke Paternal Grandmother    Hypertension Paternal Grandfather    Colon cancer Other 19       MGMs sister   Breast cancer Other 66       MGMs sister    The patient has one son who is cancer free.  She has a brother who is cancer free. Both parents are living.   The patient's father is 12 and cancer free.  He has two brothers who are deceased and did not have cancer.  His parents are deceased.   The patient's mother has polyposis with between 62-80 polyps in her lifetime.  She has a brother who had colon cancer at 5 and prostate cancer at 95.  Her parents are deceased.  Her father had stomach cancer and mother had colon cancer in her 44's. Her mother had two sisters, one who had colon cancer in her 85's and died and the other who had breast cancer in her  24's.   Ms. Tieszen is unaware of previous family history of genetic testing for hereditary cancer risks. Patient's maternal ancestors are of Caucasian descent, and paternal ancestors are of Caucasian descent. There is no reported Ashkenazi Jewish ancestry. There is no known consanguinity.  GENETIC TEST RESULTS: Genetic testing reported out on January 03, 2021 through the CancerNext-Expanded+RNAinsight cancer panel found no pathogenic mutations. The CancerNext-Expanded gene panel offered by [facility] and includes sequencing and rearrangement analysis for the following 77 genes: AIP, ALK, APC*, ATM*, AXIN2, BAP1, BARD1, BLM, BMPR1A, BRCA1*, BRCA2*, BRIP1*, CDC73, CDH1*, CDK4, CDKN1B, CDKN2A, CHEK2*, CTNNA1, DICER1, FANCC, FH, FLCN, GALNT12, KIF1B, LZTR1, MAX, MEN1, MET, MLH1*, MSH2*, MSH3, MSH6*, MUTYH*, NBN, NF1*, NF2, NTHL1, PALB2*, PHOX2B, PMS2*, POT1, PRKAR1A, PTCH1, PTEN*, RAD51C*, RAD51D*, RB1, RECQL, RET, SDHA, SDHAF2, SDHB, SDHC, SDHD, SMAD4, SMARCA4, SMARCB1, SMARCE1, STK11, SUFU, TMEM127, TP53*, TSC1, TSC2, VHL and XRCC2 (sequencing and deletion/duplication); EGFR, EGLN1, HOXB13, KIT, MITF, PDGFRA, POLD1, and POLE (sequencing only); EPCAM and GREM1 (deletion/duplication only). DNA and RNA analyses performed for * genes. The test report has been scanned into EPIC and is located under the Molecular Pathology section of the Results Review tab.  A portion of the result report is included below for reference.     We discussed with Ms. Pettibone that because current genetic testing is not perfect, it is possible  there may be a gene mutation in one of these genes that current testing cannot detect, but that chance is small.  We also discussed, that there could be another gene that has not yet been discovered, or that we have not yet tested, that is responsible for the cancer diagnoses in the family. It is also possible there is a hereditary cause for the cancer in the family that Ms. Dibella did not  inherit and therefore was not identified in her testing.  Therefore, it is important to remain in touch with cancer genetics in the future so that we can continue to offer Ms. Sheeler the most up to date genetic testing.   ADDITIONAL GENETIC TESTING: We discussed with Ms. Sepulveda that her genetic testing was fairly extensive.  If there are genes identified to increase cancer risk that can be analyzed in the future, we would be happy to discuss and coordinate this testing at that time.    CANCER SCREENING RECOMMENDATIONS: Ms. Caywood's test result is considered negative (normal).  This means that we have not identified a hereditary cause for her personal and family history of polyposis and family history of colon cancer at this time. Most cancers happen by chance and this negative test suggests that her cancer may fall into this category.    While reassuring, this does not definitively rule out a hereditary predisposition to cancer. It is still possible that there could be genetic mutations that are undetectable by current technology. There could be genetic mutations in genes that have not been tested or identified to increase cancer risk.  Therefore, it is recommended she continue to follow the cancer management and screening guidelines provided by her primary healthcare provider.   An individual's cancer risk and medical management are not determined by genetic test results alone. Overall cancer risk assessment incorporates additional factors, including personal medical history, family history, and any available genetic information that may result in a personalized plan for cancer prevention and surveillance  This negative genetic test simply tells [place] that we cannot yet define why Ms. Seneca has had an increased number of colorectal polyps. Ms. Lawhorn's medical management and screening should be based on the prospect that she will likely form more colon polyps and should, therefore, undergo  more frequent colonoscopy screening at intervals determined by her GI providers.  We also recommended that Ms. Klebba have an upper endoscopy periodically.  RECOMMENDATIONS FOR FAMILY MEMBERS:  Individuals in this family might be at some increased risk of developing cancer, over the general population risk, simply due to the family history of cancer.  We recommended women in this family have a yearly mammogram beginning at age 81, or 65 years younger than the earliest onset of cancer, an annual clinical breast exam, and perform monthly breast self-exams. Women in this family should also have a gynecological exam as recommended by their primary provider. All family members should be referred for colonoscopy starting at age 53.  FOLLOW-UP: Lastly, we discussed with Ms. Bridgett that cancer genetics is a rapidly advancing field and it is possible that new genetic tests will be appropriate for her and/or her family members in the future. We encouraged her to remain in contact with cancer genetics on an annual basis so we can update her personal and family histories and let her know of advances in cancer genetics that may benefit this family.   Our contact number was provided. Ms. Nicolson's questions were answered to her satisfaction, and she knows she is  welcome to call us at anytime with additional questions or concerns.   Roma Kayser, [place], [facility] Licensed, Certified Genetic Counselor [email or web address]

## 2021-01-06 NOTE — Telephone Encounter (Signed)
Revealed negative genetic testing.  Discussed that we do not know why she has polyposis or why there is cancer and polyposis in the family. It could be due to a different gene that we are not testing, or maybe our current technology may not be able to pick something up.  It will be important for her to keep in contact with genetics to keep up with whether additional testing may be needed.

## 2021-01-12 ENCOUNTER — Encounter: Payer: Self-pay | Admitting: Family Medicine

## 2021-01-13 ENCOUNTER — Other Ambulatory Visit: Payer: Self-pay

## 2021-01-13 MED ORDER — TIRZEPATIDE 2.5 MG/0.5ML ~~LOC~~ SOAJ: 2.5000 mg | SUBCUTANEOUS | 3 refills | Status: DC

## 2021-01-17 ENCOUNTER — Telehealth: Payer: Self-pay | Admitting: *Deleted

## 2021-01-17 NOTE — Telephone Encounter (Signed)
Your PA has been faxed to the plan as a paper copy. Please contact the plan directly if you haven't received a determination in a typical timeframe.  will be notified of the determination via fax.

## 2021-01-24 NOTE — Telephone Encounter (Signed)
Rx Mounjaro 2.'5mg'$ /0.62m pen was denied by insurance

## 2021-01-25 NOTE — Telephone Encounter (Signed)
Patient aware, Patient pickup prescription with coupon for $25

## 2021-01-25 NOTE — Telephone Encounter (Signed)
Please let patient know.  April Clayton. [name], MD 01/25/2021 8:34 AM

## 2021-02-08 ENCOUNTER — Other Ambulatory Visit: Payer: Self-pay

## 2021-02-08 ENCOUNTER — Encounter: Payer: Self-pay | Admitting: Family Medicine

## 2021-02-08 ENCOUNTER — Ambulatory Visit (INDEPENDENT_AMBULATORY_CARE_PROVIDER_SITE_OTHER): Payer: BC Managed Care – PPO | Admitting: Family Medicine

## 2021-02-08 VITALS — BP 128/90 | HR 77 | Temp 97.7°F | Ht 67.5 in | Wt 195.8 lb

## 2021-02-08 DIAGNOSIS — L989 Disorder of the skin and subcutaneous tissue, unspecified: Secondary | ICD-10-CM | POA: Diagnosis not present

## 2021-02-08 DIAGNOSIS — E1159 Type 2 diabetes mellitus with other circulatory complications: Secondary | ICD-10-CM

## 2021-02-08 DIAGNOSIS — I152 Hypertension secondary to endocrine disorders: Secondary | ICD-10-CM

## 2021-02-08 DIAGNOSIS — E1165 Type 2 diabetes mellitus with hyperglycemia: Secondary | ICD-10-CM | POA: Diagnosis not present

## 2021-02-08 DIAGNOSIS — M21619 Bunion of unspecified foot: Secondary | ICD-10-CM | POA: Diagnosis not present

## 2021-02-08 LAB — POCT GLYCOSYLATED HEMOGLOBIN (HGB A1C): Hemoglobin A1C: 5.7 % — AB (ref 4.0–5.6)

## 2021-02-08 MED ORDER — TIRZEPATIDE 5 MG/0.5ML ~~LOC~~ SOAJ: 5.0000 mg | SUBCUTANEOUS | 5 refills | Status: DC

## 2021-02-08 NOTE — Patient Instructions (Signed)
It was very nice to see you today!  Your A1c is 5.7  Please keep up the good work!  We will increase to 105 mg weekly.  Please continue taking the metformin 500 mg twice daily.  I will refer you to see a dermatologist for your chest and an orthopedist for your bunion.  Please come back to see me in 3 months.    Take care, Dr [name]  PLEASE NOTE:  If you had any lab tests please let us know if you have not heard back within a few days. You may see your results on mychart before we have a chance to review them but we will give you a call once they are reviewed by [place]. If we ordered any referrals today, please let us know if you have not heard from their office within the next week.   Please try these tips to maintain a healthy lifestyle:  Eat at least 3 REAL meals and 1-2 snacks per day.  Aim for no more than 5 hours between eating.  If you eat breakfast, please do so within one hour of getting up.   Each meal should contain half fruits/vegetables, one quarter protein, and one quarter carbs (no bigger than a computer mouse)  Cut down on sweet beverages. This includes juice, soda, and sweet tea.   Drink at least 1 glass of water with each meal and aim for at least 8 glasses per day  Exercise at least 150 minutes every week.

## 2021-02-08 NOTE — Assessment & Plan Note (Signed)
At goal on valsartan 160 mg daily and metoprolol succinate 25 mg daily.

## 2021-02-08 NOTE — Assessment & Plan Note (Signed)
She is doing very well with Mounjaro.  Will increase dose to 5 mg weekly.  We will continue metformin 500 mg twice daily.  If she has ongoing issues with low-dose would decrease dose of metformin to 500 mg daily or stop completely.  She is doing very well with her medications.  We will recheck again in 3 months.

## 2021-02-08 NOTE — Progress Notes (Signed)
   April Clayton is a 55 y.o. female who presents today for an office visit.  Assessment/Plan:  New/Acute Problems: Skin lesion Concern for basal cell carcinoma.  Plus referral to dermatology for further evaluation  Bunion Will place referral to orthopedics.  Chronic Problems Addressed Today: Type 2 diabetes mellitus with hyperglycemia, without long-term current use of insulin (April Clayton) She is doing very well with Mounjaro.  Will increase dose to 5 mg weekly.  We will continue metformin 500 mg twice daily.  If she has ongoing issues with low-dose would decrease dose of metformin to 500 mg daily or stop completely.  She is doing very well with her medications.  We will recheck again in 3 months.  Hypertension associated with diabetes (April Clayton) At goal on valsartan 160 mg daily and metoprolol succinate 25 mg daily.     Subjective:  HPI:  She is here for medication follow up.  She is currently on  Mounjaro and Metformin for type 2 diabetes mellitus. Her A1c is 5.7 today. She notes her blood sugar drops if she walk and then take metformin. She notes she take metformin after walking. She walks 3 miles a day. She checks her blood sugar at home in the morning. She notes it is usually around 106. She lost about 6 pounds.  She also complains of skin lesion in her chest area. She notes it appeared about 3 weeks ago. Denies any bug bite.       Objective:  Physical Exam: BP 128/90   Pulse 77   Temp 97.7 F (36.5 C)   Ht 5' 7.5" (1.715 m)   Wt 195 lb 12.8 oz (88.8 kg)   SpO2 96%   BMI 30.21 kg/m   Gen: No acute distress, resting comfortably CV: Regular rate and rhythm with no murmurs appreciated Pulm: Normal work of breathing, clear to auscultation bilaterally with no crackles, wheezes, or rhonchi Skin: Erythematous plaque on left anterior chest. MSK: Left great toe with bunion. Neuro: Grossly normal, moves all extremities Psych: Normal affect and thought content       I,April  Clayton,acting as a scribe for April Chyle, MD.,have documented all relevant documentation on the behalf of April Chyle, MD,as directed by  April Chyle, MD while in the presence of April Chyle, MD.   I, April Chyle, MD, have reviewed all documentation for this visit. The documentation on 02/08/21 for the exam, diagnosis, procedures, and orders are all accurate and complete.  April Clayton. April Pain, MD 02/08/2021 9:33 AM

## 2021-02-10 ENCOUNTER — Encounter: Payer: Self-pay | Admitting: Family Medicine

## 2021-02-10 NOTE — Telephone Encounter (Signed)
Can you check on her dermatology referral

## 2021-02-14 ENCOUNTER — Ambulatory Visit: Payer: BC Managed Care – PPO | Admitting: Orthopedic Surgery

## 2021-02-16 ENCOUNTER — Other Ambulatory Visit: Payer: Self-pay | Admitting: Family Medicine

## 2021-02-28 ENCOUNTER — Ambulatory Visit: Payer: BC Managed Care – PPO | Admitting: Orthopedic Surgery

## 2021-03-23 DIAGNOSIS — L821 Other seborrheic keratosis: Secondary | ICD-10-CM | POA: Diagnosis not present

## 2021-03-23 DIAGNOSIS — Z23 Encounter for immunization: Secondary | ICD-10-CM | POA: Diagnosis not present

## 2021-04-04 ENCOUNTER — Ambulatory Visit: Payer: BC Managed Care – PPO | Admitting: Orthopedic Surgery

## 2021-05-06 ENCOUNTER — Other Ambulatory Visit: Payer: Self-pay | Admitting: Family Medicine

## 2021-05-11 ENCOUNTER — Ambulatory Visit (INDEPENDENT_AMBULATORY_CARE_PROVIDER_SITE_OTHER): Payer: BC Managed Care – PPO | Admitting: Family Medicine

## 2021-05-11 ENCOUNTER — Other Ambulatory Visit: Payer: Self-pay

## 2021-05-11 ENCOUNTER — Encounter: Payer: Self-pay | Admitting: Family Medicine

## 2021-05-11 VITALS — BP 124/82 | HR 87 | Temp 97.5°F | Ht 67.0 in | Wt 191.2 lb

## 2021-05-11 DIAGNOSIS — Z23 Encounter for immunization: Secondary | ICD-10-CM | POA: Diagnosis not present

## 2021-05-11 DIAGNOSIS — E1159 Type 2 diabetes mellitus with other circulatory complications: Secondary | ICD-10-CM | POA: Diagnosis not present

## 2021-05-11 DIAGNOSIS — I152 Hypertension secondary to endocrine disorders: Secondary | ICD-10-CM

## 2021-05-11 DIAGNOSIS — E1165 Type 2 diabetes mellitus with hyperglycemia: Secondary | ICD-10-CM | POA: Diagnosis not present

## 2021-05-11 LAB — POCT GLYCOSYLATED HEMOGLOBIN (HGB A1C): Hemoglobin A1C: 5.5 % (ref 4.0–5.6)

## 2021-05-11 NOTE — Progress Notes (Signed)
° °  April Clayton is a 55 y.o. female who presents today for an office visit.  Assessment/Plan:  Chronic Problems Addressed Today: Type 2 diabetes mellitus with hyperglycemia, without long-term current use of insulin ([facility]) A1c well controlled at 5.5.  She has made changes with diet and exercise.  She is doing well with Mounjaro 5 mg weekly and metformin.  We will decrease dose of metformin to 500 mg daily.  We will recheck again in 6 months.  Continue lifestyle modifications.  Hypertension associated with diabetes ([place]) At goal on valsartan 160mg  daily and metoprolol succinate 25mg  daily.   Prevnar 20 given today.     Subjective:  HPI:  See A/p for status of chronic conditions.         Objective:  Physical Exam: BP 124/82 (BP Location: Right Arm)    Pulse 87    Temp (!) 97.5 F (36.4 C) (Temporal)    Ht 5\' 7"  (1.702 m)    Wt 191 lb 3.2 oz (86.7 kg)    SpO2 98%    BMI 29.95 kg/m   Wt Readings from Last 3 Encounters:  05/11/21 191 lb 3.2 oz (86.7 kg)  02/08/21 195 lb 12.8 oz (88.8 kg)  12/02/20 185 lb (83.9 kg)  Gen: No acute distress, resting comfortably CV: Regular rate and rhythm with no murmurs appreciated Pulm: Normal work of breathing, clear to auscultation bilaterally with no crackles, wheezes, or rhonchi Neuro: Grossly normal, moves all extremities Psych: Normal affect and thought content      Sanayah Munro M. [name], MD 05/11/2021 9:11 AM

## 2021-05-11 NOTE — Assessment & Plan Note (Signed)
A1c well controlled at 5.5.  She has made changes with diet and exercise.  She is doing well with Mounjaro 5 mg weekly and metformin.  We will decrease dose of metformin to 500 mg daily.  We will recheck again in 6 months.  Continue lifestyle modifications.

## 2021-05-11 NOTE — Patient Instructions (Signed)
It was very nice to see you today!  Your A1c is 5.5.  Please decrease your metformin to 500 mg daily.  We will give you pneumonia vaccine today.  I will see you back in 6 months.  Please come back to see me sooner if needed.  Take care, Dr [name]  PLEASE NOTE:  If you had any lab tests please let us know if you have not heard back within a few days. You may see your results on mychart before we have a chance to review them but we will give you a call once they are reviewed by [place]. If we ordered any referrals today, please let us know if you have not heard from their office within the next week.   Please try these tips to maintain a healthy lifestyle:  Eat at least 3 REAL meals and 1-2 snacks per day.  Aim for no more than 5 hours between eating.  If you eat breakfast, please do so within one hour of getting up.   Each meal should contain half fruits/vegetables, one quarter protein, and one quarter carbs (no bigger than a computer mouse)  Cut down on sweet beverages. This includes juice, soda, and sweet tea.   Drink at least 1 glass of water with each meal and aim for at least 8 glasses per day  Exercise at least 150 minutes every week.

## 2021-05-11 NOTE — Assessment & Plan Note (Signed)
At goal on valsartan 160mg  daily and metoprolol succinate 25mg  daily.

## 2021-06-02 ENCOUNTER — Other Ambulatory Visit: Payer: Self-pay | Admitting: Family Medicine

## 2021-06-02 DIAGNOSIS — Z1231 Encounter for screening mammogram for malignant neoplasm of breast: Secondary | ICD-10-CM

## 2021-06-05 ENCOUNTER — Other Ambulatory Visit: Payer: Self-pay | Admitting: Family Medicine

## 2021-06-14 ENCOUNTER — Other Ambulatory Visit: Payer: Self-pay

## 2021-06-14 ENCOUNTER — Ambulatory Visit
Admission: RE | Admit: 2021-06-14 | Discharge: 2021-06-14 | Disposition: A | Discharge status: Home / Self Care | Payer: BC Managed Care – PPO | Source: Ambulatory Visit

## 2021-06-14 ENCOUNTER — Other Ambulatory Visit: Payer: Self-pay | Admitting: Family Medicine

## 2021-06-14 DIAGNOSIS — Z1231 Encounter for screening mammogram for malignant neoplasm of breast: Secondary | ICD-10-CM

## 2021-06-22 ENCOUNTER — Telehealth: Payer: Self-pay | Admitting: Internal Medicine

## 2021-06-22 NOTE — Telephone Encounter (Signed)
Inbound call from patient insurance rep Claiborne Billings. States she spoken with the doctor and was told preventative code for patient colonoscopy procedure 12/02/2020 code be added so the procedure could be covered at 100% and a checked be mailed to cover. Best contact [phone] ask for [facility]  [email or web address]

## 2021-06-22 NOTE — Telephone Encounter (Signed)
I will send this to Barb Merino to investigate further.  The documentation on my end looks correct, but we can investigate

## 2021-06-22 NOTE — Telephone Encounter (Signed)
Attempted to call number and unable to get a person on the phone. Dr. Hilarie Fredrickson do you know anything about the message below?

## 2021-06-28 NOTE — Telephone Encounter (Signed)
Attempted to call number listed.  Does not have a VM or answer.  I will continue to try and reach the insurance company

## 2021-06-29 NOTE — Telephone Encounter (Signed)
I spoke with Claiborne Billings from [place].  She asked for a preventative code to be added to the procedure from 12/02/20.  I inquired from her exactly what that code would be.  She did not know.  I explained that the procedure was performed for a personal hx of colon polyps and a family hx of multiple first degree relatives with colon cancer.  I provided her the codes that were submitted were correct and that the claim can't be altered.  She will reach out to the patient to explain.

## 2021-07-21 ENCOUNTER — Other Ambulatory Visit: Payer: Self-pay | Admitting: Family Medicine

## 2021-07-31 ENCOUNTER — Other Ambulatory Visit: Payer: Self-pay | Admitting: Family Medicine

## 2021-07-31 DIAGNOSIS — E1159 Type 2 diabetes mellitus with other circulatory complications: Secondary | ICD-10-CM

## 2021-09-14 ENCOUNTER — Telehealth: Payer: Self-pay | Admitting: *Deleted

## 2021-09-14 NOTE — Telephone Encounter (Signed)
(  Key: [id number]) ?Rx #: [id number] ?Mounjaro '5MG'$ /0.5ML pen-injectors ?Waiting for determination  ?

## 2021-09-15 NOTE — Telephone Encounter (Signed)
Prior Authorization is not required for this medication dosage form and strength at the quantity and days supply requested. ?

## 2021-10-18 ENCOUNTER — Other Ambulatory Visit: Payer: Self-pay | Admitting: Family Medicine

## 2021-10-30 ENCOUNTER — Other Ambulatory Visit: Payer: Self-pay | Admitting: Family Medicine

## 2021-12-01 ENCOUNTER — Other Ambulatory Visit: Payer: Self-pay | Admitting: Family Medicine

## 2021-12-07 ENCOUNTER — Encounter: Payer: Self-pay | Admitting: Family Medicine

## 2021-12-09 NOTE — Telephone Encounter (Signed)
Please advise 

## 2021-12-09 NOTE — Telephone Encounter (Signed)
The nephrology screening is a good idea. We can get at her next office visit.   It is ok for them to get an additional booster before traveling. Right now RSV vaccine is only recommended for patients 60 and above. They do not need this now.  Algis Greenhouse. [name], MD 12/09/2021 10:19 AM

## 2022-01-02 ENCOUNTER — Ambulatory Visit (INDEPENDENT_AMBULATORY_CARE_PROVIDER_SITE_OTHER): Payer: BC Managed Care – PPO | Admitting: Family Medicine

## 2022-01-02 ENCOUNTER — Encounter: Payer: Self-pay | Admitting: Family Medicine

## 2022-01-02 VITALS — BP 106/68 | HR 86 | Temp 97.6°F | Ht 67.0 in | Wt 189.2 lb

## 2022-01-02 DIAGNOSIS — E1159 Type 2 diabetes mellitus with other circulatory complications: Secondary | ICD-10-CM

## 2022-01-02 DIAGNOSIS — E785 Hyperlipidemia, unspecified: Secondary | ICD-10-CM | POA: Diagnosis not present

## 2022-01-02 DIAGNOSIS — E1165 Type 2 diabetes mellitus with hyperglycemia: Secondary | ICD-10-CM | POA: Diagnosis not present

## 2022-01-02 DIAGNOSIS — E1169 Type 2 diabetes mellitus with other specified complication: Secondary | ICD-10-CM

## 2022-01-02 DIAGNOSIS — Z0001 Encounter for general adult medical examination with abnormal findings: Secondary | ICD-10-CM

## 2022-01-02 DIAGNOSIS — I152 Hypertension secondary to endocrine disorders: Secondary | ICD-10-CM

## 2022-01-02 LAB — CBC
HCT: 39.9 % (ref 36.0–46.0)
Hemoglobin: 13.5 g/dL (ref 12.0–15.0)
MCHC: 34 g/dL (ref 30.0–36.0)
MCV: 90.4 fl (ref 78.0–100.0)
Platelets: 239 10*3/uL (ref 150.0–400.0)
RBC: 4.41 Mil/uL (ref 3.87–5.11)
RDW: 13.7 % (ref 11.5–15.5)
WBC: 8.2 10*3/uL (ref 4.0–10.5)

## 2022-01-02 LAB — HEMOGLOBIN A1C: Hgb A1c MFr Bld: 6.2 % (ref 4.6–6.5)

## 2022-01-02 LAB — TSH: TSH: 2.21 u[IU]/mL (ref 0.35–5.50)

## 2022-01-02 MED ORDER — TIRZEPATIDE 7.5 MG/0.5ML ~~LOC~~ SOAJ: 7.5000 mg | SUBCUTANEOUS | 0 refills | Status: DC

## 2022-01-02 MED ORDER — METFORMIN HCL ER 500 MG PO TB24: 500.0000 mg | ORAL_TABLET | Freq: Every day | ORAL | 2 refills | Status: DC

## 2022-01-02 NOTE — Assessment & Plan Note (Signed)
Check labs.  Continue Lipitor 20 mg daily.

## 2022-01-02 NOTE — Patient Instructions (Signed)
It was very nice to see you today!  Please increase your Mounjaro to 7.5 mg weekly.  You can decrease metformin to 500 mg daily.  Keep an eye on your blood pressure.  Let me know if you have stopped your metoprolol before next visit.  Will check blood work today.  Will see back in 3 to 6 months to recheck your A1c.  Please come back to see April Clayton sooner if needed.  Take care, Dr [name]  PLEASE NOTE:  If you had any lab tests please let April Clayton know if you have not heard back within a few days. You may see your results on mychart before we have a chance to review them but we will give you a call once they are reviewed by April Clayton. If we ordered any referrals today, please let April Clayton know if you have not heard from their office within the next week.   Please try these tips to maintain a healthy lifestyle:  Eat at least 3 REAL meals and 1-2 snacks per day.  Aim for no more than 5 hours between eating.  If you eat breakfast, please do so within one hour of getting up.   Each meal should contain half fruits/vegetables, one quarter protein, and one quarter carbs (no bigger than a computer mouse)  Cut down on sweet beverages. This includes juice, soda, and sweet tea.   Drink at least 1 glass of water with each meal and aim for at least 8 glasses per day  Exercise at least 150 minutes every week.    Preventive Care 72-29 Years Old, Female Preventive care refers to lifestyle choices and visits with your health care provider that can promote health and wellness. Preventive care visits are also called wellness exams. What can I expect for my preventive care visit? Counseling Your health care provider may ask you questions about your: Medical history, including: Past medical problems. Family medical history. Pregnancy history. Current health, including: Menstrual cycle. Method of birth control. Emotional well-being. Home life and relationship well-being. Sexual activity and sexual health. Lifestyle,  including: Alcohol, nicotine or tobacco, and drug use. Access to firearms. Diet, exercise, and sleep habits. Work and work Statistician. Sunscreen use. Safety issues such as seatbelt and bike helmet use. Physical exam Your health care provider will check your: Height and weight. These may be used to calculate your BMI (body mass index). BMI is a measurement that tells if you are at a healthy weight. Waist circumference. This measures the distance around your waistline. This measurement also tells if you are at a healthy weight and may help predict your risk of certain diseases, such as type 2 diabetes and high blood pressure. Heart rate and blood pressure. Body temperature. Skin for abnormal spots. What immunizations do I need?  Vaccines are usually given at various ages, according to a schedule. Your health care provider will recommend vaccines for you based on your age, medical history, and lifestyle or other factors, such as travel or where you work. What tests do I need? Screening Your health care provider may recommend screening tests for certain conditions. This may include: Lipid and cholesterol levels. Diabetes screening. This is done by checking your blood sugar (glucose) after you have not eaten for a while (fasting). Pelvic exam and Pap test. Hepatitis B test. Hepatitis C test. HIV (human immunodeficiency virus) test. STI (sexually transmitted infection) testing, if you are at risk. Lung cancer screening. Colorectal cancer screening. Mammogram. Talk with your health care provider about when  you should start having regular mammograms. This may depend on whether you have a family history of breast cancer. BRCA-related cancer screening. This may be done if you have a family history of breast, ovarian, tubal, or peritoneal cancers. Bone density scan. This is done to screen for osteoporosis. Talk with your health care provider about your test results, treatment options, and if  necessary, the need for more tests. Follow these instructions at home: Eating and drinking  Eat a diet that includes fresh fruits and vegetables, whole grains, lean protein, and low-fat dairy products. Take vitamin and mineral supplements as recommended by your health care provider. Do not drink alcohol if: Your health care provider tells you not to drink. You are pregnant, may be pregnant, or are planning to become pregnant. If you drink alcohol: Limit how much you have to 0-1 drink a day. Know how much alcohol is in your drink. In the [place] one drink equals one 12 oz bottle of beer (355 mL), one 5 oz glass of wine (148 mL), or one 1 oz glass of hard liquor (44 mL). Lifestyle Brush your teeth every morning and night with fluoride toothpaste. Floss one time each day. Exercise for at least 30 minutes 5 or more days each week. Do not use any products that contain nicotine or tobacco. These products include cigarettes, chewing tobacco, and vaping devices, such as e-cigarettes. If you need help quitting, ask your health care provider. Do not use drugs. If you are sexually active, practice safe sex. Use a condom or other form of protection to prevent STIs. If you do not wish to become pregnant, use a form of birth control. If you plan to become pregnant, see your health care provider for a prepregnancy visit. Take aspirin only as told by your health care provider. Make sure that you understand how much to take and what form to take. Work with your health care provider to find out whether it is safe and beneficial for you to take aspirin daily. Find healthy ways to manage stress, such as: Meditation, yoga, or listening to music. Journaling. Talking to a trusted person. Spending time with friends and family. Minimize exposure to UV radiation to reduce your risk of skin cancer. Safety Always wear your seat belt while driving or riding in a vehicle. Do not drive: If you have been drinking  alcohol. Do not ride with someone who has been drinking. When you are tired or distracted. While texting. If you have been using any mind-altering substances or drugs. Wear a helmet and other protective equipment during sports activities. If you have firearms in your house, make sure you follow all gun safety procedures. Seek help if you have been physically or sexually abused. What's next? Visit your health care provider once a year for an annual wellness visit. Ask your health care provider how often you should have your eyes and teeth checked. Stay up to date on all vaccines. This information is not intended to replace advice given to you by your health care provider. Make sure you discuss any questions you have with your health care provider. Document Revised: 11/10/2020 Document Reviewed: 11/10/2020 Elsevier Patient Education  [place].

## 2022-01-02 NOTE — Progress Notes (Signed)
Chief Complaint:  April Clayton is a 56 y.o. female who presents today for her annual comprehensive physical exam.    Assessment/Plan:  Chronic Problems Addressed Today: Type 2 diabetes mellitus with hyperglycemia, without long-term current use of insulin (HCC) Check A1c.  Will be increasing her Mounjaro 7.5 mg weekly decrease metformin to 500 mg daily.  She will follow-up again in 3 to 6 months to recheck.  Hypertension associated with diabetes ([place]) Blood pressure well-controlled at 106/68.  Will continue metoprolol succinate 25 mg daily and valsartan 160 mg daily.  She will continue to monitor at home.  Would be reasonable to potentially stop her beta-blocker at some point in the near future given her readings.  We can discuss again at her next office visit she will let me know if her pressures are persistently low at home and she can stop ahead of this visit.  Dyslipidemia associated with type 2 diabetes mellitus ([place]) Check labs.  Continue Lipitor 20 mg daily.  Preventative Healthcare: Up-to-date on vaccines.  Check labs.  Will get eye exam later this year.  Patient Counseling(The following topics were reviewed and/or handout was given):  -Nutrition: Stressed importance of moderation in sodium/caffeine intake, saturated fat and cholesterol, caloric balance, sufficient intake of fresh fruits, vegetables, and fiber.  -Stressed the importance of regular exercise.   -Substance Abuse: Discussed cessation/primary prevention of tobacco, alcohol, or other drug use; driving or other dangerous activities under the influence; availability of treatment for abuse.   -Injury prevention: Discussed safety belts, safety helmets, smoke detector, smoking near bedding or upholstery.   -Sexuality: Discussed sexually transmitted diseases, partner selection, use of condoms, avoidance of unintended pregnancy and contraceptive alternatives.   -Dental health: Discussed importance of regular tooth brushing,  flossing, and dental visits.  -Health maintenance and immunizations reviewed. Please refer to Health maintenance section.  Return to care in 1 year for next preventative visit.     Subjective:  HPI:  She has no acute complaints today. See A/p for status of chronic conditions.   Lifestyle Diet: Balanced. Plenty of fruits and vegetables.  Exercise: Likes to go hiking.      02/08/2021    8:36 AM  Depression screen PHQ 2/9  Decreased Interest 0  Down, Depressed, Hopeless 0  PHQ - 2 Score 0    Health Maintenance Due  Topic Date Due   HIV Screening  Never done   OPHTHALMOLOGY EXAM  04/01/2021   HEMOGLOBIN A1C  11/09/2021   INFLUENZA VACCINE  12/27/2021     ROS: Per HPI, otherwise a complete review of systems was negative.   PMH:  The following were reviewed and entered/updated in epic: Past Medical History:  Diagnosis Date   Abnormal Pap smear of cervix    in late 20's   Childhood asthma    Colon polyps    Diabetes mellitus without complication ([place])    type 2   Family history of malignant neoplasm of gastrointestinal tract    Family history of polyps in the colon    Hyperlipidemia    Hypertension    Shingles    Patient Active Problem List   Diagnosis Date Noted   Genetic testing 01/06/2021   Family history of polyps in the colon 12/20/2020   Colon polyps 12/20/2020   Family history of malignant neoplasm of gastrointestinal tract 12/20/2020   Hypertension associated with diabetes ([place]) 12/02/2018   Type 2 diabetes mellitus with hyperglycemia, without long-term current use of insulin ([place]) 12/02/2018  Dyslipidemia associated with type 2 diabetes mellitus ([place]) 12/02/2018   Seasonal allergies 12/02/2018   Family history of colon cancer 12/02/2018   Past Surgical History:  Procedure Laterality Date   APPENDECTOMY     cecum removal     CERVICAL CONE BIOPSY     abnormal pap smear   CHOLECYSTECTOMY     COLONOSCOPY     2014 and 2019 and numerous over the  years due to [place]      Family History  Problem Relation Age of Onset   Hypertension Mother    Colon polyps Mother    Hypertension Father    Heart disease Father    Diabetes Brother    Hypertension Brother    Colon cancer Maternal Uncle 72   Colon cancer Maternal Grandmother 20       d. 50   Hypertension Maternal Grandmother    Hypertension Maternal Grandfather    Diabetes Paternal Grandmother    Hypertension Paternal Grandmother    Stroke Paternal Grandmother    Hypertension Paternal Grandfather    Colon cancer Other 24       MGMs sister   Breast cancer Other 60       MGMs sister    Medications- reviewed and updated Current Outpatient Medications  Medication Sig Dispense Refill   atorvastatin (LIPITOR) 20 MG tablet TAKE 1 TABLET BY MOUTH EVERY DAY 90 tablet 2   B Complex Vitamins (B COMPLEX PO) Take by mouth.     cetirizine (ZYRTEC) 10 MG tablet Take 10 mg by mouth daily.     cholecalciferol (VITAMIN D3) 25 MCG (1000 UNIT) tablet Take 1,000 Units by mouth daily.     metoprolol succinate (TOPROL-XL) 25 MG 24 hr tablet TAKE 1 TABLET BY MOUTH EVERY DAY 90 tablet 3   tirzepatide (MOUNJARO) 7.5 MG/0.5ML Pen Inject 7.5 mg into the skin once a week. 6 mL 0   UNABLE TO FIND Multi collagen complex 3 capsules daily     valsartan (DIOVAN) 160 MG tablet TAKE 1 TABLET BY MOUTH EVERY DAY 90 tablet 0   metFORMIN (GLUCOPHAGE-XR) 500 MG 24 hr tablet Take 1 tablet (500 mg total) by mouth daily with breakfast. 360 tablet 2   No current facility-administered medications for this visit.    Allergies-reviewed and updated Allergies  Allergen Reactions   Nonoxynol-9     Social History   Socioeconomic History   Marital status: Married    Spouse name: Not on file   Number of children: Not on file   Years of education: Not on file   Highest education level: Not on file  Occupational History   Not on file  Tobacco Use   Smoking status: Never    Smokeless tobacco: Never  Vaping Use   Vaping Use: Never used  Substance and Sexual Activity   Alcohol use: Yes    Comment: occasionally , less than monthly   Drug use: Never   Sexual activity: Yes    Partners: Male    Birth control/protection: Post-menopausal  Other Topics Concern   Not on file  Social History Narrative   Not on file   Social Determinants of Health   Financial Resource Strain: Not on file  Food Insecurity: Not on file  Transportation Needs: Not on file  Physical Activity: Not on file  Stress: Not on file  Social Connections: Not on file        Objective:  Physical Exam: BP 106/68  Pulse 86   Temp 97.6 F (36.4 C) (Temporal)   Ht '5\' 7"'$  (1.702 m)   Wt 189 lb 3.2 oz (85.8 kg)   SpO2 100%   BMI 29.63 kg/m   Body mass index is 29.63 kg/m. Wt Readings from Last 3 Encounters:  01/02/22 189 lb 3.2 oz (85.8 kg)  05/11/21 191 lb 3.2 oz (86.7 kg)  02/08/21 195 lb 12.8 oz (88.8 kg)   Gen: NAD, resting comfortably HEENT: TMs normal bilaterally. OP clear. No thyromegaly noted.  CV: RRR with no murmurs appreciated Pulm: NWOB, CTAB with no crackles, wheezes, or rhonchi GI: Normal bowel sounds present. Soft, Nontender, Nondistended. MSK: no edema, cyanosis, or clubbing noted Skin: warm, dry Neuro: CN2-12 grossly intact. Strength 5/5 in upper and lower extremities. Reflexes symmetric and intact bilaterally.  Psych: Normal affect and thought content     Deeann Servidio M. [name], MD 01/02/2022 8:49 AM

## 2022-01-02 NOTE — Assessment & Plan Note (Signed)
Blood pressure well-controlled at 106/68.  Will continue metoprolol succinate 25 mg daily and valsartan 160 mg daily.  She will continue to monitor at home.  Would be reasonable to potentially stop her beta-blocker at some point in the near future given her readings.  We can discuss again at her next office visit she will let me know if her pressures are persistently low at home and she can stop ahead of this visit.

## 2022-01-02 NOTE — Assessment & Plan Note (Signed)
Check A1c.  Will be increasing her Mounjaro 7.5 mg weekly decrease metformin to 500 mg daily.  She will follow-up again in 3 to 6 months to recheck.

## 2022-01-03 LAB — COMPREHENSIVE METABOLIC PANEL
ALT: 20 U/L (ref 0–35)
AST: 23 U/L (ref 0–37)
Albumin: 4.3 g/dL (ref 3.5–5.2)
Alkaline Phosphatase: 94 U/L (ref 39–117)
BUN: 17 mg/dL (ref 6–23)
CO2: 22 mEq/L (ref 19–32)
Calcium: 9.2 mg/dL (ref 8.4–10.5)
Chloride: 104 mEq/L (ref 96–112)
Creatinine, Ser: 0.65 mg/dL (ref 0.40–1.20)
GFR: 98.59 mL/min (ref >60.00)
Glucose, Bld: 92 mg/dL (ref 70–99)
Potassium: 4.3 mEq/L (ref 3.5–5.1)
Sodium: 145 mEq/L (ref 135–145)
Total Bilirubin: 0.5 mg/dL (ref 0.2–1.2)
Total Protein: 6.8 g/dL (ref 6.0–8.3)

## 2022-01-03 LAB — LDL CHOLESTEROL, DIRECT: Direct LDL: 73 mg/dL

## 2022-01-03 LAB — LIPID PANEL
Cholesterol: 135 mg/dL (ref 0–200)
HDL: 35.6 mg/dL — ABNORMAL LOW (ref >39.00)
NonHDL: 99.01
Total CHOL/HDL Ratio: 4
Triglycerides: 253 mg/dL — ABNORMAL HIGH (ref 0.0–149.0)
VLDL: 50.6 mg/dL — ABNORMAL HIGH (ref 0.0–40.0)

## 2022-01-03 NOTE — Progress Notes (Signed)
Please inform patient of the following:  Her A1c is up a bit since last time but everything else is stable.  I hope this will improve with [place] adjusting her dose of Mounjaro.  We should recheck again in 3 to 6 months.

## 2022-01-13 ENCOUNTER — Telehealth: Payer: Self-pay | Admitting: Family Medicine

## 2022-01-13 NOTE — Telephone Encounter (Signed)
Patient states: - She was informed by PCP that mounjaro had a yearly renewal on it but she only saw 2 refills were sent in - She is leaving the country for 3 weeks and needs this medication - She was also informed that she would be able to request 3 month refills online but she hasn't been able to    Patient requests: - Medication be sent in for a year's supply  - Junious Dresser give her a callback to discuss further

## 2022-01-14 ENCOUNTER — Other Ambulatory Visit: Payer: Self-pay | Admitting: *Deleted

## 2022-01-14 MED ORDER — TIRZEPATIDE 7.5 MG/0.5ML ~~LOC~~ SOAJ
7.5000 mg | SUBCUTANEOUS | 3 refills | Status: DC
Start: 2022-01-14 — End: 2022-04-23

## 2022-01-14 NOTE — Telephone Encounter (Signed)
Spoke wit patient Rx refill send to [facility]  Patient stated per insurance unable to pick up 3 month quantity

## 2022-02-15 DIAGNOSIS — H5213 Myopia, bilateral: Secondary | ICD-10-CM | POA: Diagnosis not present

## 2022-02-15 DIAGNOSIS — E119 Type 2 diabetes mellitus without complications: Secondary | ICD-10-CM | POA: Diagnosis not present

## 2022-02-15 DIAGNOSIS — H2513 Age-related nuclear cataract, bilateral: Secondary | ICD-10-CM | POA: Diagnosis not present

## 2022-02-15 LAB — HM DIABETES EYE EXAM

## 2022-02-20 ENCOUNTER — Encounter: Payer: Self-pay | Admitting: Family Medicine

## 2022-03-02 ENCOUNTER — Other Ambulatory Visit: Payer: Self-pay | Admitting: Family Medicine

## 2022-04-07 ENCOUNTER — Other Ambulatory Visit: Payer: Self-pay | Admitting: *Deleted

## 2022-04-07 MED ORDER — VALSARTAN 160 MG PO TABS: 160.0000 mg | ORAL_TABLET | Freq: Every day | ORAL | 1 refills | Status: DC

## 2022-04-22 ENCOUNTER — Encounter: Payer: Self-pay | Admitting: Family Medicine

## 2022-04-23 ENCOUNTER — Other Ambulatory Visit: Payer: Self-pay | Admitting: *Deleted

## 2022-04-24 MED ORDER — TIRZEPATIDE 7.5 MG/0.5ML ~~LOC~~ SOAJ
7.5000 mg | SUBCUTANEOUS | 3 refills | Status: DC
Start: 2022-04-24 — End: 2022-10-02

## 2022-06-06 ENCOUNTER — Ambulatory Visit (INDEPENDENT_AMBULATORY_CARE_PROVIDER_SITE_OTHER): Payer: BC Managed Care – PPO | Admitting: Family Medicine

## 2022-06-06 ENCOUNTER — Encounter: Payer: Self-pay | Admitting: Family Medicine

## 2022-06-06 VITALS — BP 91/61 | HR 76 | Temp 97.7°F | Ht 67.0 in | Wt 185.4 lb

## 2022-06-06 DIAGNOSIS — I152 Hypertension secondary to endocrine disorders: Secondary | ICD-10-CM

## 2022-06-06 DIAGNOSIS — E1165 Type 2 diabetes mellitus with hyperglycemia: Secondary | ICD-10-CM

## 2022-06-06 DIAGNOSIS — E785 Hyperlipidemia, unspecified: Secondary | ICD-10-CM

## 2022-06-06 DIAGNOSIS — E1159 Type 2 diabetes mellitus with other circulatory complications: Secondary | ICD-10-CM

## 2022-06-06 DIAGNOSIS — M21619 Bunion of unspecified foot: Secondary | ICD-10-CM | POA: Diagnosis not present

## 2022-06-06 DIAGNOSIS — E1169 Type 2 diabetes mellitus with other specified complication: Secondary | ICD-10-CM

## 2022-06-06 LAB — POCT GLYCOSYLATED HEMOGLOBIN (HGB A1C): Hemoglobin A1C: 5.4 % (ref 4.0–5.6)

## 2022-06-06 NOTE — Assessment & Plan Note (Signed)
Blood pressure very well-controlled today 91/61.  Will stop metoprolol today.  Continue valsartan 160 mg daily.  She will continue to monitor at home.  She will let me know if blood pressures are persistently elevated at home.  We will recheck at her next office visit in about 6 months.

## 2022-06-06 NOTE — Progress Notes (Signed)
   April Clayton is a 57 y.o. female who presents today for an office visit.  Assessment/Plan:  New/Acute Problems: Bunion Will refer to podiatry for further evaluation and management.  Chronic Problems Addressed Today: Type 2 diabetes mellitus with hyperglycemia, without long-term current use of insulin (HCC) A1c well-controlled 5.4.  Okay for her to stop metformin.  Continue Mounjaro 7.5 mg weekly.  Recheck A1c in 6 months at her CPE.  Hypertension associated with diabetes ([place]) Blood pressure very well-controlled today 91/61.  Will stop metoprolol today.  Continue valsartan 160 mg daily.  She will continue to monitor at home.  She will let me know if blood pressures are persistently elevated at home.  We will recheck at her next office visit in about 6 months.  Dyslipidemia associated with type 2 diabetes mellitus ([place]) She is tolerating Lipitor 20 mg daily well.  Patient was concerned about increased risk for dementia. Discussed that there are no reasons for her to stop at this point.  Advised patient that there may be some case reports of reversible cognitive impairment in folks with dementia however no studies show that statins cause Alzheimer's.  She is agreeable to continue for now.  Will check lipids at next office visit.    Subjective:  HPI:  See A/P for status of chronic conditions.  Patient is here today for follow-up.  She was last seen a few months ago for annual physical.  A1c at that time was 6.2.  We increased her Mounjaro to 7.5 mg weekly.  She has been walking more and about 25-30 miles per week. She has been compliant with her medications.  She is having some GI issues with metformin and would like to stop.  Blood pressure readings at home have been at goal.  She has been doing with the bunion on her left foot and would like referral to podiatrist for further evaluation.       Objective:  Physical Exam: BP 91/61   Pulse 76   Temp 97.7 F (36.5 C) (Temporal)   Ht  '5\' 7"'$  (1.702 m)   Wt 185 lb 6.4 oz (84.1 kg)   SpO2 95%   BMI 29.04 kg/m   Wt Readings from Last 3 Encounters:  06/06/22 185 lb 6.4 oz (84.1 kg)  01/02/22 189 lb 3.2 oz (85.8 kg)  05/11/21 191 lb 3.2 oz (86.7 kg)    Gen: No acute distress, resting comfortably CV: Regular rate and rhythm with no murmurs appreciated Pulm: Normal work of breathing, clear to auscultation bilaterally with no crackles, wheezes, or rhonchi Neuro: Grossly normal, moves all extremities Psych: Normal affect and thought content      Hannan Hutmacher M. [name], MD 06/06/2022 9:47 AM

## 2022-06-06 NOTE — Assessment & Plan Note (Signed)
She is tolerating Lipitor 20 mg daily well.  Patient was concerned about increased risk for dementia. Discussed that there are no reasons for her to stop at this point.  Advised patient that there may be some case reports of reversible cognitive impairment in folks with dementia however no studies show that statins cause Alzheimer's.  She is agreeable to continue for now.  Will check lipids at next office visit.

## 2022-06-06 NOTE — Assessment & Plan Note (Signed)
A1c well-controlled 5.4.  Okay for her to stop metformin.  Continue Mounjaro 7.5 mg weekly.  Recheck A1c in 6 months at her CPE.

## 2022-06-06 NOTE — Patient Instructions (Signed)
It was very nice to see you today!  Your A1c today looks great.  I will take metformin and metoprolol for your medication list.  Please keep up the great work with your diet and exercise.  I will refer you to see podiatrist for your bunion.  Please send me a message in a few weeks to let me know if you have any blood pressure issues.  We will see you back later this year when you are due for your annual physical.  Please come back to see [place] sooner if needed.  Take care, Dr [name]  PLEASE NOTE:  If you had any lab tests, please let us know if you have not heard back within a few days. You may see your results on mychart before we have a chance to review them but we will give you a call once they are reviewed by [place].   If we ordered any referrals today, please let us know if you have not heard from their office within the next week.   If you had any urgent prescriptions sent in today, please check with the pharmacy within an hour of our visit to make sure the prescription was transmitted appropriately.   Please try these tips to maintain a healthy lifestyle:  Eat at least 3 REAL meals and 1-2 snacks per day.  Aim for no more than 5 hours between eating.  If you eat breakfast, please do so within one hour of getting up.   Each meal should contain half fruits/vegetables, one quarter protein, and one quarter carbs (no bigger than a computer mouse)  Cut down on sweet beverages. This includes juice, soda, and sweet tea.   Drink at least 1 glass of water with each meal and aim for at least 8 glasses per day  Exercise at least 150 minutes every week.

## 2022-06-22 ENCOUNTER — Ambulatory Visit: Payer: BC Managed Care – PPO | Admitting: Podiatry

## 2022-06-22 ENCOUNTER — Ambulatory Visit (INDEPENDENT_AMBULATORY_CARE_PROVIDER_SITE_OTHER): Payer: BC Managed Care – PPO

## 2022-06-22 DIAGNOSIS — M21619 Bunion of unspecified foot: Secondary | ICD-10-CM

## 2022-06-22 DIAGNOSIS — M79672 Pain in left foot: Secondary | ICD-10-CM

## 2022-06-22 DIAGNOSIS — M79671 Pain in right foot: Secondary | ICD-10-CM

## 2022-06-22 DIAGNOSIS — M21611 Bunion of right foot: Secondary | ICD-10-CM | POA: Diagnosis not present

## 2022-06-22 NOTE — Patient Instructions (Signed)
Pre-Operative Instructions  Congratulations, you have decided to take an important step to improving your quality of life.  You can be assured that the doctors of [facility] will be with you every step of the way.  Plan to be at the surgery center/hospital at least 1 (one) hour prior to your scheduled time unless otherwise directed by the surgical center/hospital staff.  You must have a responsible adult accompany you, remain during the surgery and drive you home.  Make sure you have directions to the surgical center/hospital and know how to get there on time. For hospital based surgery you will need to obtain a history and physical form from your family physician within 1 month prior to the date of surgery- we will give you a form for you primary physician.  We make every effort to accommodate the date you request for surgery.  There are however, times where surgery dates or times have to be moved.  We will contact you as soon as possible if a change in schedule is required.   No Aspirin/Ibuprofen for one week before surgery.  If you are on aspirin, any non-steroidal anti-inflammatory medications (Mobic, Aleve, Ibuprofen) you should stop taking it 7 days prior to your surgery.  You make take Tylenol  For pain prior to surgery.  Medications- If you are taking daily heart and blood pressure medications, seizure, reflux, allergy, asthma, anxiety, pain or diabetes medications, make sure the surgery center/hospital is aware before the day of surgery so they may notify you which medications to take or avoid the day of surgery. No food or drink after midnight the night before surgery unless directed otherwise by surgical center/hospital staff. No alcoholic beverages 24 hours prior to surgery.  No smoking 24 hours prior to or 24 hours after surgery. Wear loose pants or shorts- loose enough to fit over bandages, boots, and casts. No slip on shoes, sneakers are best. Bring your boot with you to the  surgery center/hospital.  Also bring crutches or a walker if your physician has prescribed it for you.  If you do not have this equipment, it will be provided for you after surgery. If you have not been contracted by the surgery center/hospital by the day before your surgery, call to confirm the date and time of your surgery. Leave-time from work may vary depending on the type of surgery you have.  Appropriate arrangements should be made prior to surgery with your employer. Prescriptions will be provided immediately following surgery by your doctor.  Have these filled as soon as possible after surgery and take the medication as directed. Remove nail polish on the operative foot. Wash the night before surgery.  The night before surgery wash the foot and leg well with the antibacterial soap provided and water paying special attention to beneath the toenails and in between the toes.  Rinse thoroughly with water and dry well with a towel.  Perform this wash unless told not to do so by your physician.  Enclosed: 1 Ice pack (please put in freezer the night before surgery)   1 Hibiclens skin cleaner   Pre-op Instructions  If you have any questions regarding the instructions, do not hesitate to call our office at any point during this process.   [place]: [address] [place], [place] [address] [phone]  [place]: [address] [place], [place] [address] [phone]  Dr. Takela Varden, DPM  

## 2022-06-22 NOTE — Progress Notes (Signed)
Subjective:   Patient ID: April Clayton, female   DOB: 57 y.o.   MRN: [id number]   HPI Chief Complaint  Patient presents with   Bunions    Bilateral feet, right over left     History of encephalopathy with above concerns.  She wants to hike [place]. She does about 25-30 miles per week. After 8 miles she can start feeling. Her grandmother had them as well. She tries to wear good shoes and take care of them.  Patient 4 years well.  She was consider other options to help fix the issue.   She is diabetic and she is on Mounjaro Last A1c was 5.4 on January 2024  Negative tobacoo No history blood clots   Review of Systems  All other systems reviewed and are negative.  Past Medical History:  Diagnosis Date   Abnormal Pap smear of cervix    in late 20's   Childhood asthma    Colon polyps    Diabetes mellitus without complication ([place])    type 2   Family history of malignant neoplasm of gastrointestinal tract    Family history of polyps in the colon    Hyperlipidemia    Hypertension    Shingles     Past Surgical History:  Procedure Laterality Date   APPENDECTOMY     cecum removal     CERVICAL CONE BIOPSY     abnormal pap smear   CHOLECYSTECTOMY     COLONOSCOPY     2014 and 2019 and numerous over the years due to [place]       Current Outpatient Medications:    atorvastatin (LIPITOR) 20 MG tablet, TAKE 1 TABLET BY MOUTH EVERY DAY, Disp: 90 tablet, Rfl: 2   B Complex Vitamins (B COMPLEX PO), Take by mouth., Disp: , Rfl:    cetirizine (ZYRTEC) 10 MG tablet, Take 10 mg by mouth daily., Disp: , Rfl:    cholecalciferol (VITAMIN D3) 25 MCG (1000 UNIT) tablet, Take 1,000 Units by mouth daily., Disp: , Rfl:    tirzepatide (MOUNJARO) 7.5 MG/0.5ML Pen, Inject 7.5 mg into the skin once a week., Disp: 6 mL, Rfl: 3   UNABLE TO FIND, Multi collagen complex 3 capsules daily, Disp: , Rfl:    valsartan (DIOVAN) 160 MG tablet, Take 1 tablet  (160 mg total) by mouth daily., Disp: 90 tablet, Rfl: 1  Allergies  Allergen Reactions   Nonoxynol-9           Objective:  Physical Exam  General: AAO x3, NAD  Dermatological: Skin is warm, dry and supple bilateral.  There are no open sores, no preulcerative lesions, no rash or signs of infection present.  Vascular: Dorsalis Pedis artery and Posterior Tibial artery pedal pulses are 2/4 bilateral with immedate capillary fill time.  There is no pain with calf compression, swelling, warmth, erythema.   Neruologic: Grossly intact via light touch bilateral.    Musculoskeletal: Moderate bunions present bilaterally right side worse than left with tenderness palpation mild erythema directly on the first metatarsal head.  There is no pain or crepitation with MPJ range of motion is no hypermobility of the first ray.  There is no other areas of discomfort noted today.  Gait: Unassisted, Nonantalgic.       Assessment:   57 year old female with symptomatic bunion     Plan:  -Treatment options discussed including all alternatives, risks, and complications -Etiology of symptoms were discussed -X-rays were obtained reviewed.  Moderate plain is present moderate increase in the first metatarsal angle.  No evidence of acute fracture. -We discussed the conservative as well as surgical treatment options.  She is tender conservative care she wants to proceed with surgical intervention remove the bunion. -We discussed right foot [place] bunionectomy and she wants to proceed with this. -The incision placement as well as the postoperative course was discussed with the patient. I discussed risks of the surgery which include, but not limited to, infection, bleeding, pain, swelling, need for further surgery, delayed or nonhealing, painful or ugly scar, numbness or sensation changes, over/under correction, recurrence, transfer lesions, further deformity, hardware failure, DVT/PE, loss of toe/foot. Patient  understands these risks and wishes to proceed with surgery. The surgical consent was reviewed with the patient all 3 pages were signed. No promises or guarantees were given to the outcome of the procedure. All questions were answered to the best of my ability. Before the surgery the patient was encouraged to call the office if there is any further questions. The surgery will be performed at the [facility] on an outpatient basis. -Boot for postop immobilization  Trula Slade DPM

## 2022-06-28 ENCOUNTER — Other Ambulatory Visit: Payer: Self-pay | Admitting: Family Medicine

## 2022-06-28 DIAGNOSIS — Z1231 Encounter for screening mammogram for malignant neoplasm of breast: Secondary | ICD-10-CM

## 2022-07-04 ENCOUNTER — Telehealth: Payer: Self-pay | Admitting: Urology

## 2022-07-04 NOTE — Telephone Encounter (Signed)
DOS - 08/02/22  [place] BUNIONECTOMY RIGHT --- [phone]  [employer]   SPOKE WITH MARIAH WITH [employer] AND SHE STATED THAT FOR CPT CODE 02111 NO PRIOR AUTH IS REQUIRED.  REF # [id number]

## 2022-07-13 ENCOUNTER — Ambulatory Visit
Admission: RE | Admit: 2022-07-13 | Discharge: 2022-07-13 | Disposition: A | Discharge status: Home / Self Care | Payer: BC Managed Care – PPO | Source: Ambulatory Visit | Attending: Family Medicine | Admitting: Family Medicine

## 2022-07-13 ENCOUNTER — Other Ambulatory Visit: Payer: Self-pay | Admitting: Family Medicine

## 2022-07-13 DIAGNOSIS — Z1231 Encounter for screening mammogram for malignant neoplasm of breast: Secondary | ICD-10-CM

## 2022-08-02 ENCOUNTER — Encounter: Payer: Self-pay | Admitting: Podiatry

## 2022-08-02 ENCOUNTER — Other Ambulatory Visit: Payer: Self-pay | Admitting: Podiatry

## 2022-08-02 DIAGNOSIS — G8918 Other acute postprocedural pain: Secondary | ICD-10-CM | POA: Diagnosis not present

## 2022-08-02 DIAGNOSIS — M2011 Hallux valgus (acquired), right foot: Secondary | ICD-10-CM | POA: Diagnosis not present

## 2022-08-02 DIAGNOSIS — M21611 Bunion of right foot: Secondary | ICD-10-CM | POA: Diagnosis not present

## 2022-08-02 HISTORY — PX: METATARSAL OSTEOTOMY WITH BUNIONECTOMY: SHX5662

## 2022-08-02 MED ORDER — CEPHALEXIN 500 MG PO CAPS: 500.0000 mg | ORAL_CAPSULE | Freq: Three times a day (TID) | ORAL | 0 refills | Status: DC

## 2022-08-02 MED ORDER — PROMETHAZINE HCL 25 MG PO TABS: 25.0000 mg | ORAL_TABLET | Freq: Three times a day (TID) | ORAL | 0 refills | Status: DC | PRN

## 2022-08-02 MED ORDER — HYDROCODONE-ACETAMINOPHEN 5-325 MG PO TABS: 1.0000 | ORAL_TABLET | Freq: Four times a day (QID) | ORAL | 0 refills | Status: DC | PRN

## 2022-08-02 MED ORDER — IBUPROFEN 800 MG PO TABS: 800.0000 mg | ORAL_TABLET | Freq: Three times a day (TID) | ORAL | 0 refills | Status: DC | PRN

## 2022-08-02 NOTE — Progress Notes (Signed)
Post op medications sent 

## 2022-08-07 ENCOUNTER — Ambulatory Visit: Payer: BC Managed Care – PPO

## 2022-08-08 ENCOUNTER — Ambulatory Visit (INDEPENDENT_AMBULATORY_CARE_PROVIDER_SITE_OTHER): Payer: BC Managed Care – PPO | Admitting: Podiatry

## 2022-08-08 ENCOUNTER — Ambulatory Visit (INDEPENDENT_AMBULATORY_CARE_PROVIDER_SITE_OTHER): Payer: BC Managed Care – PPO

## 2022-08-08 VITALS — BP 140/82 | HR 92

## 2022-08-08 DIAGNOSIS — M21611 Bunion of right foot: Secondary | ICD-10-CM

## 2022-08-08 DIAGNOSIS — M21619 Bunion of unspecified foot: Secondary | ICD-10-CM

## 2022-08-08 NOTE — Progress Notes (Unsigned)
Patient presents today for post op visit # 1 , patient of Dr. Jacqualyn Posey   POV #1 DOS 08/02/22 --- RIGHT FOOT SURGICAL CORRECTION OF BUNION([place] BUNIONECTOMY)    Patient presents in walking boot non-weight bearing. Denies any falls or injury to the foot. Foot is slightly swollen. No signs of infection. No calf pain or shortness of breath. Bandages dry and intact. Incision is intact.  Taking antibiotic as prescribed and pain medication as needed.   BP: 140/82 P: 92     Xrays taken today and reviewed by Dr. Jacqualyn Posey. He did take a look at the foot today as well.   Foot redressed today and placed back in the boot. Reviewed icing and elevation. Patient will follow up with Dr. Jacqualyn Posey for POV# 2.   --  I did personally evaluate the patient.  Incisions healing without any signs of infection.  There are some mild edema present but no erythema or warmth.  There is no drainage or pus.  There is no fluctuation or crepitation is noted.  No signs of infection today.  Antibiotic ointment and dressing applied.  She can keep the dressing clean, dry, intact.  She can transition to weightbearing in a cam boot as tolerated but continue ice, elevate.  X-rays were obtained and reviewed.  3 views of the foot were obtained.  Status post first metatarsal osteotomy with screw fixation with any complicating factors.  No evidence of acute fracture.  Trula Slade DPM

## 2022-08-17 ENCOUNTER — Ambulatory Visit (INDEPENDENT_AMBULATORY_CARE_PROVIDER_SITE_OTHER): Payer: BC Managed Care – PPO | Admitting: Podiatry

## 2022-08-17 DIAGNOSIS — M21619 Bunion of unspecified foot: Secondary | ICD-10-CM | POA: Diagnosis not present

## 2022-08-17 MED ORDER — GABAPENTIN 300 MG PO CAPS: 300.0000 mg | ORAL_CAPSULE | Freq: Every day | ORAL | 0 refills | Status: DC

## 2022-08-17 NOTE — Progress Notes (Signed)
Subjective: Chief Complaint  Patient presents with   Routine Post Op    POV #1 DOS 08/02/22 --- RIGHT FOOT SURGICAL CORRECTION OF BUNION([place] BUNIONECTOMY) , patient denies any pain, NO N/V/F/C/SOB, tingling in the hallux, burning in the arch of the foot     April Clayton is a 57 y.o. is seen today in office s/p right foot bunionectomy preformed on 08/02/2022.  States that she is doing well.  No significant pain.  She still in the cam boot.  Denies any systemic complaints such as fevers, chills, nausea, vomiting. No calf pain, chest pain, shortness of breath.   Objective: General: No acute distress, AAOx3  DP/PT pulses palpable 2/4, CRT < 3 sec to all digits.  Protective sensation intact. Motor function intact.  Right foot: Incision is well coapted without any evidence of dehiscence sutures intact. There is no surrounding erythema, ascending cellulitis, fluctuance, crepitus, malodor, drainage/purulence. There is mild and improved edema around the surgical site. There is no significant pain along the surgical site.  No other areas of tenderness to bilateral lower extremities.  No other open lesions or pre-ulcerative lesions.  No pain with calf compression, swelling, warmth, erythema.   Assessment and Plan:  Status post right foot bunionectomy, doing well with no complications   -Treatment options discussed including all alternatives, risks, and complications -Overall she is doing better and the swelling is also improved.  Toe is rectus.  Incisions healing well without any signs of infection.  Antibiotic ointment dressing applied.  She will start to wash with soap and water apply a similar bandage.  Do not submerge the foot. -Continue cam boot for now. -Ice/elevation -Pain medication as needed. -Monitor for any clinical signs or symptoms of infection and DVT/PE and directed to call the office immediately should any occur or go to the ER. -Follow-up as scheduled or sooner if any problems  arise. In the meantime, encouraged to call the office with any questions, concerns, change in symptoms.   *Repeat x-ray next appointment likely transition to surgical shoe  Celesta Gentile, DPM

## 2022-08-17 NOTE — Progress Notes (Signed)
Patient presents today for post op visit # 2, patient of Dr. Jacqualyn Posey.   POV # 2 DOS 08/02/2022    Sutures were not removed today.  Incisions look good and no signs of infections. Patient states she has no pain just some tingling in the hallux and burning in the arch of the foot.   Reviewed icing and elevation. Patient will follow up with Dr. Jacqualyn Posey  for POV# 3 in 2 weeks .

## 2022-08-31 ENCOUNTER — Other Ambulatory Visit: Payer: Self-pay | Admitting: Podiatry

## 2022-08-31 ENCOUNTER — Ambulatory Visit (INDEPENDENT_AMBULATORY_CARE_PROVIDER_SITE_OTHER): Payer: BC Managed Care – PPO

## 2022-08-31 ENCOUNTER — Ambulatory Visit (INDEPENDENT_AMBULATORY_CARE_PROVIDER_SITE_OTHER): Payer: BC Managed Care – PPO | Admitting: Podiatry

## 2022-08-31 DIAGNOSIS — Z9889 Other specified postprocedural states: Secondary | ICD-10-CM

## 2022-08-31 DIAGNOSIS — R2681 Unsteadiness on feet: Secondary | ICD-10-CM

## 2022-08-31 DIAGNOSIS — M21619 Bunion of unspecified foot: Secondary | ICD-10-CM

## 2022-08-31 DIAGNOSIS — M21611 Bunion of right foot: Secondary | ICD-10-CM

## 2022-08-31 NOTE — Progress Notes (Signed)
Subjective: Chief Complaint  Patient presents with   Routine Post Op    Pt stated that she is doing well denies any pain at this time     Valentino Huendrea Smith is a 57 y.o. is seen today in office s/p right foot bunionectomy preformed on 08/02/2022.  States that she is doing well and is eager to become more active.  She has not had significant pain.  No fevers or chills.  No other concerns.    Objective: General: No acute distress, AAOx3  DP/PT pulses palpable 2/4, CRT < 3 sec to all digits.  Protective sensation intact. Motor function intact.  Right foot: Incision is well coapted without any evidence of dehiscence.  There is no significant tenderness palpation.  There is slight edema there is no erythema or warmth.  There is no pain with MPJ range of motion.  There is no other area discomfort. No pain with calf compression, swelling, warmth, erythema.   Assessment and Plan:  Status post right foot bunionectomy, doing well with no complications   -Treatment options discussed including all alternatives, risks, and complications -X-rays obtained reviewed.  3 views of the foot were obtained.  No evidence of acute fracture.  Hardware intact.  Increased consolidation noted across the osteotomy site. -At this time we discussed transitioning to weightbearing as tolerated in cam boot and will start physical therapy.  Referral for [facility].  In about 2 weeks she can start to transition back to regular shoe as tolerated gradually.  Continue ice elevate as well as compression to help with any residual edema.  Return in about 3 weeks (around 09/21/2022).  X-ray next appointment  Vivi BarrackMatthew R Redonna Wilbert DPM

## 2022-09-21 ENCOUNTER — Telehealth: Payer: Self-pay | Admitting: Family Medicine

## 2022-09-21 NOTE — Telephone Encounter (Signed)
Caller states she is needing the ICD code for Mounjaro 7.5 mg. Rowan Blase can be contacted at [phone] with this information.

## 2022-09-25 ENCOUNTER — Ambulatory Visit (INDEPENDENT_AMBULATORY_CARE_PROVIDER_SITE_OTHER): Payer: BC Managed Care – PPO

## 2022-09-25 ENCOUNTER — Ambulatory Visit (INDEPENDENT_AMBULATORY_CARE_PROVIDER_SITE_OTHER): Payer: BC Managed Care – PPO | Admitting: Podiatry

## 2022-09-25 DIAGNOSIS — M21619 Bunion of unspecified foot: Secondary | ICD-10-CM

## 2022-09-25 DIAGNOSIS — M21611 Bunion of right foot: Secondary | ICD-10-CM

## 2022-09-25 DIAGNOSIS — R2681 Unsteadiness on feet: Secondary | ICD-10-CM

## 2022-09-25 NOTE — Telephone Encounter (Signed)
Code provided to pharmacy 

## 2022-09-27 NOTE — Progress Notes (Signed)
Subjective: Chief Complaint  Patient presents with   Routine Post Op    POV #4 DOS 08/02/22 --- RIGHT FOOT SURGICAL CORRECTION OF BUNION([place] BUNIONECTOMY). Patient father passed a few days ago, patient has had some swelling up will have to hike.That is something that is is going to continue to do to help with coping for now. 4 PT appointments and they have canceled all 4 and patient hasn't been to PT but has been looking up exercises to help with the flexibility. Top of the foot does still have some numbness.    April Clayton is a 57 y.o. is seen today in office s/p right foot bunionectomy preformed on 08/02/2022.  States that she has been feeling well.  She is back to wearing a regular shoe.  Her father recently passed away a couple days ago and she is eager to get back to hiking.  No recent injuries or falls.  Still gets some intermittent swelling.  No fevers or chills.    Objective: General: No acute distress, AAOx3  DP/PT pulses palpable 2/4, CRT < 3 sec to all digits.  Protective sensation intact. Motor function intact.  Right foot: Incision is well coapted without any evidence of dehiscence.  Scar is forming.  Toe is rectus.  No significant pain on exam.  Minimal edema.  No erythema or warmth.  She does get some numbness along the surgical site.  Mild restriction present.  No pain or crepitation with range of motion. No pain with calf compression, swelling, warmth, erythema.   Assessment and Plan:  Status post right foot bunionectomy, doing well with no complications   -Treatment options discussed including all alternatives, risks, and complications -X-rays obtained reviewed.  3 views of the foot were obtained.  Hardware intact.  Increased consolidation noted across the osteotomy site.  No evidence of acute fracture. -Was not able to complete physical therapy and I reordered this for her today.  There was a scheduling issue and her appointment was canceled.  Discussed range of motion  exercises.  She can gradually transition to regular shoe, activity as tolerated full-time.  I have a graphite insert to help protect our system and get back to hiking.  Continue ice, elevate as well as compression.  Return in about 6 weeks (around 11/06/2022). X-ray next appointment    Vivi Barrack DPM

## 2022-09-29 ENCOUNTER — Other Ambulatory Visit: Payer: Self-pay | Admitting: Family Medicine

## 2022-09-30 ENCOUNTER — Encounter: Payer: Self-pay | Admitting: Family Medicine

## 2022-10-02 ENCOUNTER — Other Ambulatory Visit: Payer: Self-pay | Admitting: *Deleted

## 2022-10-02 MED ORDER — TIRZEPATIDE 7.5 MG/0.5ML ~~LOC~~ SOAJ: 7.5000 mg | SUBCUTANEOUS | 3 refills | Status: DC

## 2022-10-03 ENCOUNTER — Ambulatory Visit: Payer: BC Managed Care – PPO | Attending: Podiatry

## 2022-10-03 ENCOUNTER — Other Ambulatory Visit: Payer: Self-pay

## 2022-10-03 DIAGNOSIS — R262 Difficulty in walking, not elsewhere classified: Secondary | ICD-10-CM | POA: Diagnosis not present

## 2022-10-03 DIAGNOSIS — R2681 Unsteadiness on feet: Secondary | ICD-10-CM | POA: Insufficient documentation

## 2022-10-03 DIAGNOSIS — M21619 Bunion of unspecified foot: Secondary | ICD-10-CM | POA: Diagnosis not present

## 2022-10-03 DIAGNOSIS — M79671 Pain in right foot: Secondary | ICD-10-CM | POA: Diagnosis not present

## 2022-10-03 NOTE — Therapy (Signed)
OUTPATIENT PHYSICAL THERAPY LOWER EXTREMITY EVALUATION   Patient Name: April Clayton MRN: [id number] DOB:06/26/1965, 57 y.o., female Today's Date: 10/04/2022  END OF SESSION:  PT End of Session - 10/04/22 0610     Visit Number 1    Number of Visits 4    Date for PT Re-Evaluation 12/08/22    Authorization Type BCBS COMM PPO    PT Start Time 1153    PT Stop Time 1240    PT Time Calculation (min) 47 min    Activity Tolerance Patient tolerated treatment well    Behavior During Therapy WFL for tasks assessed/performed             Past Medical History:  Diagnosis Date   Abnormal Pap smear of cervix    in late 20's   Childhood asthma    Colon polyps    Diabetes mellitus without complication (HCC)    type 2   Family history of malignant neoplasm of gastrointestinal tract    Family history of polyps in the colon    Hyperlipidemia    Hypertension    Shingles    Past Surgical History:  Procedure Laterality Date   APPENDECTOMY     cecum removal     CERVICAL CONE BIOPSY     abnormal pap smear   CHOLECYSTECTOMY     COLONOSCOPY     2014 and 2019 and numerous over the years due to [facility]   TONSILLECTOMY AND ADENOIDECTOMY     Patient Active Problem List   Diagnosis Date Noted   Genetic testing 01/06/2021   Family history of polyps in the colon 12/20/2020   Colon polyps 12/20/2020   Family history of malignant neoplasm of gastrointestinal tract 12/20/2020   Hypertension associated with diabetes (HCC) 12/02/2018   Type 2 diabetes mellitus with hyperglycemia, without long-term current use of insulin (HCC) 12/02/2018   Dyslipidemia associated with type 2 diabetes mellitus (HCC) 12/02/2018   Seasonal allergies 12/02/2018   Family history of colon cancer 12/02/2018    PCP: Ardith Dark, MD   REFERRING PROVIDER: Vivi Barrack, DPM   REFERRING DIAG: [phone] (ICD-10-CM) - Bunion, R26.81 (ICD-10-CM) - Gait instability   THERAPY DIAG:  Pain in right  foot  Difficulty in walking, not elsewhere classified  Rationale for Evaluation and Treatment: Rehabilitation  ONSET DATE: 08/02/22  SUBJECTIVE:   SUBJECTIVE STATEMENT: Pt reports undergoing R Bunion surgery on 08/02/22 and she is doing well following this procedure with intermittent low level of pain. Pt reports she has returned to walking 4 miles 3x per week. Pt notes she wishes to return to hiking longer distance and eventually hike the [place] [address] in approx 2 years.  PERTINENT HISTORY: Diabetes mellitus without complication, HTN  PAIN:  Are you having pain? Yes: NPRS scale: 0-1/10 Pain location: MTP joint Pain description: ache Aggravating factors: prolonged walking Relieving factors: Advil, rest  PRECAUTIONS: None  WEIGHT BEARING RESTRICTIONS: No  FALLS:  Has patient fallen in last 6 months? No  LIVING ENVIRONMENT: Lives with: lives with their family Lives in: House/apartment  OCCUPATION: Retired ER nurse  PLOF: Independent  PATIENT GOALS:To hike consistently 6-8 miles on an easy to moderate grade trail  NEXT MD VISIT: 6//11/24  OBJECTIVE:   DIAGNOSTIC FINDINGS:   PATIENT SURVEYS:  FOTO 88%  COGNITION: Overall cognitive status: Within functional limits for tasks assessed     SENSATION: WFL  EDEMA:  Swelling of R great toe post surgery. Healed incision  MUSCLE LENGTH: Hamstrings: Right NT  deg; Left NT deg Maisie Fus test: Right NT deg; Left NT deg  POSTURE: No Significant postural limitations  PALPATION: TTP R MTP  LOWER EXTREMITY ROM:  Active ROM Right eval Left eval  Hip flexion    Hip extension    Hip abduction    Hip adduction    Hip internal rotation    Hip external rotation    Knee flexion    Knee extension    Ankle dorsiflexion    Ankle plantarflexion    Ankle inversion    Ankle eversion     (Blank rows = not tested)  LOWER EXTREMITY MMT:  Grossly WNLs for hips and knees MMT Right eval Left eval  Hip flexion    Hip  extension    Hip abduction    Hip adduction    Hip internal rotation    Hip external rotation    Knee flexion    Knee extension    Ankle dorsiflexion 10 10  Ankle plantarflexion    Ankle inversion    Ankle eversion     MTP ext    40  40 (Blank rows = not tested)  LOWER EXTREMITY SPECIAL TESTS:  NT  FUNCTIONAL TESTS:  5 times sit to stand: TBA 2 minute walk test: TBA  Single leg stance TBA  GAIT: Distance walked: 200 Assistive device utilized: None Level of assistance: Complete Independence Comments: Out toeing   TODAY'S TREATMENT:                                                                                                                               [facility] Adult PT Treatment:                                                DATE: 10/03/22 Therapeutic Exercise: Developed, instructed in, and pt completed therex as noted in HEP   PATIENT EDUCATION:  Education details: Eval findings, POC, HEP Person educated: Patient Education method: Explanation, Demonstration, Tactile cues, Verbal cues, and Handouts Education comprehension: verbalized understanding, returned demonstration, verbal cues required, and tactile cues required  HOME EXERCISE PROGRAM: Access Code: [facility] URL: [email or web address] Date: 10/03/2022 Prepared by: Joellyn Rued  Exercises - Seated Toe Towel Scrunches  - 1 x daily - 7 x weekly - 2 sets - 10 reps - Seated Toe Raise  - 1 x daily - 7 x weekly - 2 sets - 10 reps - 2 hold - Seated Heel Raise  - 1 x daily - 7 x weekly - 2 sets - 10 reps - 2 hold - Gastroc Stretch on Wall  - 1 x daily - 7 x weekly - 1 sets - 3 reps - 20-30 hold - Soleus Stretch on Wall  - 1 x daily - 7 x weekly - 1 sets - 3 reps - 20-30  hold - Standing Single Leg Stance with Counter Support  - 1 x daily - 7 x weekly - 1 sets - 3-5 reps - 20-30 hold  ASSESSMENT:  CLINICAL IMPRESSION: Patient is a 57 y.o. female who was seen today for physical therapy evaluation and  treatment for M21.619 (ICD-10-CM) - Bunion, R26.81 (ICD-10-CM) - Gait instability   OBJECTIVE IMPAIRMENTS: decreased activity tolerance, decreased balance, difficulty walking, and pain.   ACTIVITY LIMITATIONS: stairs and locomotion level  PARTICIPATION LIMITATIONS:  Recreation- hiking  PERSONAL FACTORS: Past/current experiences and Time since onset of injury/illness/exacerbation are also affecting patient's functional outcome.   REHAB POTENTIAL: Excellent  CLINICAL DECISION MAKING: Stable/uncomplicated  EVALUATION COMPLEXITY: Low   GOALS:  SHORT TERM GOALS: Target date: 10/27/22 Pt will be Ind in a final HEP to maintain achieved LOF  Baseline:started Goal status: INITIAL  2.  Pt will voice understanding of measures to assist in pain reduction  Baseline:  Goal status: INITIAL  LONG TERM GOALS: Target date: 12/08/22  Pt will be Ind in a final HEP to maintain achieved LOF  Baseline: started Goal status: INITIAL  2.  Pt will demonstrate SL balance of 15" or greater for appropriate balance with dynamic activities Baseline: TBA Goal status: INITIAL  3.  Pt will report tolerated hiking on low to moderate grade for 6 miles 2x in a week as indication of improved tolerance with her recreational pursuit of hiking Baseline: current 4 miles on level surface 3x per week Goal status: INITIAL  4.  Increase bilat AROM for ankle DF to 15d with knees straight for improved ankle mobility in preparation for hiking uneven surfaces Baseline: 10d Goal status: INITIAL  PLAN:  PT FREQUENCY:  1x per 2 weeks  PT DURATION: 8 weeks  PLANNED INTERVENTIONS: Therapeutic exercises, Therapeutic activity, Neuromuscular re-education, Balance training, Gait training, Patient/Family education, Self Care, Joint mobilization, Stair training, DME instructions, Dry Needling, Electrical stimulation, Cryotherapy, Moist heat, Taping, Vasopneumatic device, Ultrasound, Ionotophoresis 4mg /ml Dexamethasone, Manual  therapy, and Re-evaluation  PLAN FOR NEXT SESSION: Review FOTO; assess response to HEP; progress therex as indicated; use of modalities, manual therapy; and TPDN as indicated.    Andre Swander MS, PT 10/04/22 6:10 PM

## 2022-10-05 NOTE — Telephone Encounter (Signed)
(  Key: Hulda Humphrey) - [id number] Mounjaro 7.5MG /0.5ML pen-injectors Status: PA Request Sent: May 9th, 2024 Waiting for determination

## 2022-10-17 NOTE — Therapy (Signed)
OUTPATIENT PHYSICAL THERAPY TREATMENT NOTE   Patient Name: April Clayton MRN: [id number] DOB:[birth year], 57 y.o., female Today's Date: 10/18/2022  PCP: Ardith Dark, MD   REFERRING PROVIDER: Vivi Barrack, DPM   END OF SESSION:   PT End of Session - 10/18/22 1156     Visit Number 2    Number of Visits 4    Date for PT Re-Evaluation 12/08/22    Authorization Type BCBS COMM PPO    PT Start Time 1150    PT Stop Time 1233    PT Time Calculation (min) 43 min    Activity Tolerance Patient tolerated treatment well    Behavior During Therapy WFL for tasks assessed/performed             Past Medical History:  Diagnosis Date   Abnormal Pap smear of cervix    in late 20's   Childhood asthma    Colon polyps    Diabetes mellitus without complication (HCC)    type 2   Family history of malignant neoplasm of gastrointestinal tract    Family history of polyps in the colon    Hyperlipidemia    Hypertension    Shingles    Past Surgical History:  Procedure Laterality Date   APPENDECTOMY     cecum removal     CERVICAL CONE BIOPSY     abnormal pap smear   CHOLECYSTECTOMY     COLONOSCOPY     2014 and 2019 and numerous over the years due to [facility]   TONSILLECTOMY AND ADENOIDECTOMY     Patient Active Problem List   Diagnosis Date Noted   Genetic testing 01/06/2021   Family history of polyps in the colon 12/20/2020   Colon polyps 12/20/2020   Family history of malignant neoplasm of gastrointestinal tract 12/20/2020   Hypertension associated with diabetes (HCC) 12/02/2018   Type 2 diabetes mellitus with hyperglycemia, without long-term current use of insulin (HCC) 12/02/2018   Dyslipidemia associated with type 2 diabetes mellitus (HCC) 12/02/2018   Seasonal allergies 12/02/2018   Family history of colon cancer 12/02/2018    REFERRING DIAG: M21.619 (ICD-10-CM) - Bunion, R26.81 (ICD-10-CM) - Gait instability   THERAPY DIAG:  Pain in right foot  Difficulty in  walking, not elsewhere classified  Rationale for Evaluation and Treatment Rehabilitation  ONSET DATE: 08/02/22   SUBJECTIVE:    SUBJECTIVE STATEMENT: Pt reports the stiffness of her R great toe is much better. She is now able to raise up on R her toes and it feels normal. Pt reports she is walking approx 4.5 miles every other day.Marland Kitchen    PAIN:  Are you having pain? Yes: NPRS scale: 0/10 Pain location: MTP joint Pain description: ache Aggravating factors: prolonged walking Relieving factors: Advil, rest  PERTINENT HISTORY: Diabetes mellitus without complication, HTN   PRECAUTIONS: None   WEIGHT BEARING RESTRICTIONS: No   FALLS:  Has patient fallen in last 6 months? No   LIVING ENVIRONMENT: Lives with: lives with their family Lives in: House/apartment   OCCUPATION: Retired ER nurse   PLOF: Independent   PATIENT GOALS:To hike consistently 6-8 miles on an easy to moderate grade trail   NEXT MD VISIT: 6//11/24   OBJECTIVE: (objective measures completed at initial evaluation unless otherwise dated)   DIAGNOSTIC FINDINGS:    PATIENT SURVEYS:  FOTO 88%   COGNITION: Overall cognitive status: Within functional limits for tasks assessed  SENSATION: WFL   EDEMA:  Swelling of R great toe post surgery. Healed incision   MUSCLE LENGTH: Hamstrings: Right NT deg; Left NT deg Maisie Fus test: Right NT deg; Left NT deg   POSTURE: No Significant postural limitations   PALPATION: TTP R MTP   LOWER EXTREMITY ROM:   Active ROM Right eval Left eval  Hip flexion      Hip extension      Hip abduction      Hip adduction      Hip internal rotation      Hip external rotation      Knee flexion      Knee extension      Ankle dorsiflexion      Ankle plantarflexion      Ankle inversion      Ankle eversion       (Blank rows = not tested)   LOWER EXTREMITY MMT:  Grossly WNLs for hips and knees MMT Right eval Left eval  Hip flexion      Hip  extension      Hip abduction      Hip adduction      Hip internal rotation      Hip external rotation      Knee flexion      Knee extension      Ankle dorsiflexion 10 10  Ankle plantarflexion      Ankle inversion      Ankle eversion       MTP ext                                         40                  40 (Blank rows = not tested)   LOWER EXTREMITY SPECIAL TESTS:  NT   FUNCTIONAL TESTS:  5 times sit to stand: TBA 2 minute walk test: TBA            Single leg stance TBA   GAIT: Distance walked: 200 Assistive device utilized: None Level of assistance: Complete Independence Comments: Out toeing     TODAY'S TREATMENT:   [facility] Adult PT Treatment:                                                DATE: 10/18/22 Therapeutic Exercise: Slant board stretch for gastroc and soleus 30"x2 each Heel raises 2x10 Ankle DF, ev, inv 2x10 GTB Band side steps 43ftx4 GTB Wall slide squats 2x10 Updated HEP                                                                                                                         [facility] Adult PT Treatment:  DATE: 10/03/22 Therapeutic Exercise: Developed, instructed in, and pt completed therex as noted in HEP    PATIENT EDUCATION:  Education details: Eval findings, POC, HEP Person educated: Patient Education method: Explanation, Demonstration, Tactile cues, Verbal cues, and Handouts Education comprehension: verbalized understanding, returned demonstration, verbal cues required, and tactile cues required   HOME EXERCISE PROGRAM: Access Code: [facility] URL: [email or web address] Date: 10/18/2022 Prepared by: Joellyn Rued  Exercises - Seated Toe Towel Scrunches  - 1 x daily - 7 x weekly - 2 sets - 10 reps - Gastroc Stretch on Wall  - 1 x daily - 7 x weekly - 1 sets - 3 reps - 20-30 hold - Soleus Stretch on Wall  - 1 x daily - 7 x weekly - 1 sets - 3 reps - 20-30 hold - Standing Single Leg  Stance with Counter Support  - 1 x daily - 7 x weekly - 1 sets - 3-5 reps - 20-30 hold - Heel Raises with Counter Support  - 1 x daily - 7 x weekly - 2-3 sets - 10 reps - Seated Ankle Dorsiflexion with Resistance  - 1 x daily - 7 x weekly - 2-3 sets - 10 reps - 2 hold - Seated Ankle Eversion with Resistance (Mirrored)  - 1 x daily - 7 x weekly - 2-3 sets - 10 reps - 2 hold - Seated Figure 4 Ankle Inversion with Resistance  - 1 x daily - 7 x weekly - 2-3 sets - 10 reps - 2 hold - Wall Squat  - 1 x daily - 7 x weekly - 2-3 sets - 10 reps - 2 hold - Side Stepping with Resistance at Thighs  - 1 x daily - 7 x weekly - 2-3 sets - 10 reps - 2 hold   ASSESSMENT:   CLINICAL IMPRESSION: PT was completed for R foot and LE strengthening and heel cord flexibility to improve pt's functional use of the R foot and LE. Pt tolerated the prescribed therex without adverse effects. Pt is making appropriate progress re: function of the R foot and LE. Pt will continue to benefit from skilled PT to address impairments for improved functional mobility.  OBJECTIVE IMPAIRMENTS: decreased activity tolerance, decreased balance, difficulty walking, and pain.    ACTIVITY LIMITATIONS: stairs and locomotion level   PARTICIPATION LIMITATIONS:  Recreation- hiking   PERSONAL FACTORS: Past/current experiences and Time since onset of injury/illness/exacerbation are also affecting patient's functional outcome.    REHAB POTENTIAL: Excellent   CLINICAL DECISION MAKING: Stable/uncomplicated   EVALUATION COMPLEXITY: Low     GOALS:   SHORT TERM GOALS: Target date: 10/27/22 Pt will be Ind in a final HEP to maintain achieved LOF  Baseline:started Goal status: INITIAL   2.  Pt will voice understanding of measures to assist in pain reduction  Baseline:  Goal status: INITIAL   LONG TERM GOALS: Target date: 12/08/22   Pt will be Ind in a final HEP to maintain achieved LOF  Baseline: started Goal status: INITIAL   2.  Pt  will demonstrate SL balance of 15" or greater for appropriate balance with dynamic activities Baseline: TBA Goal status: INITIAL   3.  Pt will report tolerated hiking on low to moderate grade for 6 miles 2x in a week as indication of improved tolerance with her recreational pursuit of hiking Baseline: current 4 miles on level surface 3x per week Goal status: INITIAL   4.  Increase bilat AROM for ankle DF to 15d with knees straight  for improved ankle mobility in preparation for hiking uneven surfaces Baseline: 10d Goal status: INITIAL   PLAN:   PT FREQUENCY:  1x per 2 weeks   PT DURATION: 8 weeks   PLANNED INTERVENTIONS: Therapeutic exercises, Therapeutic activity, Neuromuscular re-education, Balance training, Gait training, Patient/Family education, Self Care, Joint mobilization, Stair training, DME instructions, Dry Needling, Electrical stimulation, Cryotherapy, Moist heat, Taping, Vasopneumatic device, Ultrasound, Ionotophoresis 4mg /ml Dexamethasone, Manual therapy, and Re-evaluation   PLAN FOR NEXT SESSION: Review FOTO; assess response to HEP; progress therex as indicated; use of modalities, manual therapy; and TPDN as indicated. Assess SL stance, 5STS, and the    Valarie Farace MS, PT 10/18/22 3:19 PM

## 2022-10-18 ENCOUNTER — Ambulatory Visit: Payer: BC Managed Care – PPO

## 2022-10-18 DIAGNOSIS — R262 Difficulty in walking, not elsewhere classified: Secondary | ICD-10-CM | POA: Diagnosis not present

## 2022-10-18 DIAGNOSIS — M21619 Bunion of unspecified foot: Secondary | ICD-10-CM | POA: Diagnosis not present

## 2022-10-18 DIAGNOSIS — M79671 Pain in right foot: Secondary | ICD-10-CM

## 2022-10-18 DIAGNOSIS — R2681 Unsteadiness on feet: Secondary | ICD-10-CM | POA: Diagnosis not present

## 2022-11-01 NOTE — Therapy (Signed)
OUTPATIENT PHYSICAL THERAPY TREATMENT NOTE/Discharge   Patient Name: April Clayton MRN: [id number] DOB:[birth year], 57 y.o., female Today's Date: 11/02/2022  PCP: Ardith Dark, MD   REFERRING PROVIDER: Vivi Barrack, DPM   END OF SESSION:   PT End of Session - 11/02/22 1236     Visit Number 3    Number of Visits 4    Authorization Type BCBS COMM PPO    PT Start Time 1105    PT Stop Time 1145    PT Time Calculation (min) 40 min    Activity Tolerance Patient tolerated treatment well    Behavior During Therapy WFL for tasks assessed/performed              Past Medical History:  Diagnosis Date   Abnormal Pap smear of cervix    in late 20's   Childhood asthma    Colon polyps    Diabetes mellitus without complication (HCC)    type 2   Family history of malignant neoplasm of gastrointestinal tract    Family history of polyps in the colon    Hyperlipidemia    Hypertension    Shingles    Past Surgical History:  Procedure Laterality Date   APPENDECTOMY     cecum removal     CERVICAL CONE BIOPSY     abnormal pap smear   CHOLECYSTECTOMY     COLONOSCOPY     2014 and 2019 and numerous over the years due to [facility]   TONSILLECTOMY AND ADENOIDECTOMY     Patient Active Problem List   Diagnosis Date Noted   Genetic testing 01/06/2021   Family history of polyps in the colon 12/20/2020   Colon polyps 12/20/2020   Family history of malignant neoplasm of gastrointestinal tract 12/20/2020   Hypertension associated with diabetes (HCC) 12/02/2018   Type 2 diabetes mellitus with hyperglycemia, without long-term current use of insulin (HCC) 12/02/2018   Dyslipidemia associated with type 2 diabetes mellitus (HCC) 12/02/2018   Seasonal allergies 12/02/2018   Family history of colon cancer 12/02/2018    REFERRING DIAG: M21.619 (ICD-10-CM) - Bunion, R26.81 (ICD-10-CM) - Gait instability   THERAPY DIAG:  Pain in right foot  Difficulty in walking, not elsewhere  classified  Rationale for Evaluation and Treatment Rehabilitation  ONSET DATE: 08/02/22   SUBJECTIVE:    SUBJECTIVE STATEMENT: Pt reports concern re: medial drift of her R great toe. She uses a toe separator to help prevent medial movement. Pt reports no pain with her HEP or with walking. Pt is walking 3 days a week for 4-4.5 miles. Pt notes her progression with walking distance has been limited by various circumstances in her life Pt is pleased with her progress.   PAIN:  Are you having pain? Yes: NPRS scale: 0/10 Pain location: MTP joint Pain description: ache Aggravating factors: prolonged walking Relieving factors: Advil, rest  PERTINENT HISTORY: Diabetes mellitus without complication, HTN   PRECAUTIONS: None   WEIGHT BEARING RESTRICTIONS: No   FALLS:  Has patient fallen in last 6 months? No   LIVING ENVIRONMENT: Lives with: lives with their family Lives in: House/apartment   OCCUPATION: Retired ER nurse   PLOF: Independent   PATIENT GOALS:To hike consistently 6-8 miles on an easy to moderate grade trail   NEXT MD VISIT: 6//11/24   OBJECTIVE: (objective measures completed at initial evaluation unless otherwise dated)   DIAGNOSTIC FINDINGS:    PATIENT SURVEYS:  FOTO 88%   COGNITION: Overall cognitive status: Within functional limits for tasks  assessed                         SENSATION: WFL   EDEMA:  Swelling of R great toe post surgery. Healed incision   MUSCLE LENGTH: Hamstrings: Right NT deg; Left NT deg Maisie Fus test: Right NT deg; Left NT deg   POSTURE: No Significant postural limitations   PALPATION: TTP R MTP   LOWER EXTREMITY ROM:   Active ROM Right eval Left eval  Hip flexion      Hip extension      Hip abduction      Hip adduction      Hip internal rotation      Hip external rotation      Knee flexion      Knee extension      Ankle dorsiflexion      Ankle plantarflexion      Ankle inversion      Ankle eversion       (Blank rows  = not tested)   LOWER EXTREMITY MMT:  Grossly WNLs for hips and knees MMT Right eval Left eval Rt 11/02/22 Lt 11/02/22  Hip flexion        Hip extension        Hip abduction        Hip adduction        Hip internal rotation        Hip external rotation        Knee flexion        Knee extension        Ankle dorsiflexion 10 10 15 15   Ankle plantarflexion        Ankle inversion        Ankle eversion         MTP ext                                         40                  40 (Blank rows = not tested)   LOWER EXTREMITY SPECIAL TESTS:  NT   FUNCTIONAL TESTS:  5 times sit to stand: TBA 2 minute walk test: TBA            Single leg stance TBA   GAIT: Distance walked: 200 Assistive device utilized: None Level of assistance: Complete Independence Comments: Out toeing     TODAY'S TREATMENT:   [facility] Adult PT Treatment:                                                DATE: 11/02/22 Therapeutic Exercise: Toe spreading for great toe abd Sl balance activities c weighted alphabet and lateral swings, 5# Banded side steps BluTB Bnaded hip abd BluTB Banded hip ext BluTB Wall slide squats Updated HEP Self Care Discussed options for R great toe support to minimize medial drift    [facility] Adult PT Treatment:                                                DATE: 10/18/22 Therapeutic Exercise:  Slant board stretch for gastroc and soleus 30"x2 each Heel raises 2x10 Ankle DF, ev, inv 2x10 GTB Band side steps 4ftx4 GTB Wall slide squats 2x10 Updated HEP                                                                                                                         [facility] Adult PT Treatment:                                                DATE: 10/03/22 Therapeutic Exercise: Developed, instructed in, and pt completed therex as noted in HEP    PATIENT EDUCATION:  Education details: Eval findings, POC, HEP Person educated: Patient Education method: Explanation, Demonstration, Tactile  cues, Verbal cues, and Handouts Education comprehension: verbalized understanding, returned demonstration, verbal cues required, and tactile cues required   HOME EXERCISE PROGRAM: Access Code: [facility] URL: [email or web address] Date: 11/02/2022 Prepared by: Joellyn Rued  Exercises - Seated Toe Towel Scrunches  - 1 x daily - 7 x weekly - 2 sets - 10 reps - Gastroc Stretch on Wall  - 1 x daily - 7 x weekly - 1 sets - 3 reps - 20-30 hold - Soleus Stretch on Wall  - 1 x daily - 7 x weekly - 1 sets - 3 reps - 20-30 hold - Standing Single Leg Stance with Counter Support  - 1 x daily - 7 x weekly - 1 sets - 3-5 reps - 20-30 hold - Heel Raises with Counter Support  - 1 x daily - 7 x weekly - 2-3 sets - 10 reps - Seated Ankle Dorsiflexion with Resistance  - 1 x daily - 7 x weekly - 2-3 sets - 10 reps - 2 hold - Seated Ankle Eversion with Resistance (Mirrored)  - 1 x daily - 7 x weekly - 2-3 sets - 10 reps - 2 hold - Seated Figure 4 Ankle Inversion with Resistance  - 1 x daily - 7 x weekly - 2-3 sets - 10 reps - 2 hold - Wall Squat  - 1 x daily - 7 x weekly - 2-3 sets - 10 reps - 2 hold - Side Stepping with Resistance at Thighs  - 1 x daily - 7 x weekly - 2-3 sets - 10 reps - 2 hold - Toe Spreading  - 1 x daily - 7 x weekly - 2-3 sets - 10 reps - 2 hold   ASSESSMENT:   CLINICAL IMPRESSION: Pt completed her course of PT today being pleased with her progress. Goals Re: ROM and balance were MET. Pt's walking distance has not been increased due to various circumstances in her life. Pt is walking frequently without pain.Discussed options to limit medial drift of the  R great toe per support or by abductor hallicus strengthening. Pt is Ind in a HEP to continue the  rehab process and to progress toward her goal of walking the AT in approx 2 years. Pt is agreeable to DC from PT at this time.  OBJECTIVE IMPAIRMENTS: decreased activity tolerance, decreased balance, difficulty walking, and pain.     ACTIVITY LIMITATIONS: stairs and locomotion level   PARTICIPATION LIMITATIONS:  Recreation- hiking   PERSONAL FACTORS: Past/current experiences and Time since onset of injury/illness/exacerbation are also affecting patient's functional outcome.    REHAB POTENTIAL: Excellent   CLINICAL DECISION MAKING: Stable/uncomplicated   EVALUATION COMPLEXITY: Low     GOALS:   SHORT TERM GOALS: Target date: 10/27/22 Pt will be Ind in a final HEP to maintain achieved LOF  Baseline:started Goal status: MET   2.  Pt will voice understanding of measures to assist in pain reduction  Baseline:  Goal status: MET   LONG TERM GOALS: Target date: 12/08/22   Pt will be Ind in a final HEP to maintain achieved LOF  Baseline: started Goal status: MET   2.  Pt will demonstrate SL balance of 15" or greater for appropriate balance with dynamic activities Baseline: TBA Status: 11/02/22=30+ sec Goal status: MET   3.  Pt will report tolerated hiking on low to moderate grade for 6 miles 2x in a week as indication of improved tolerance with her recreational pursuit of hiking Baseline: current 4 miles on level surface 3x per week Status: 11/02/22= Due to various circumstance pt has not progressed her walking schedule Goal status: Not met   4.  Increase bilat AROM for ankle DF to 15d with knees straight for improved ankle mobility in preparation for hiking uneven surfaces Baseline: 10d Status: 11/02/22= 15d bilat Goal status: INITIAL   PLAN:   PT FREQUENCY:  1x per 2 weeks   PT DURATION: 8 weeks   PLANNED INTERVENTIONS: Therapeutic exercises, Therapeutic activity, Neuromuscular re-education, Balance training, Gait training, Patient/Family education, Self Care, Joint mobilization, Stair training, DME instructions, Dry Needling, Electrical stimulation, Cryotherapy, Moist heat, Taping, Vasopneumatic device, Ultrasound, Ionotophoresis 4mg /ml Dexamethasone, Manual therapy, and Re-evaluation   PLAN FOR NEXT  SESSION: Review FOTO; assess response to HEP; progress therex as indicated; use of modalities, manual therapy; and TPDN as indicated. Assess SL stance, 5STS, and the  PHYSICAL THERAPY DISCHARGE SUMMARY  Visits from Start of Care: 3  Current functional level related to goals / functional outcomes: See clinical impression and PT goals    Remaining deficits: See clinical impression and PT goals    Education / Equipment: HEP and pt ed   Patient agrees to discharge. Patient goals were partially met. Patient is being discharged due to being pleased with the current functional level.     Tailey Top MS, PT 11/02/22 1:05 PM

## 2022-11-02 ENCOUNTER — Ambulatory Visit: Payer: BC Managed Care – PPO | Attending: Podiatry

## 2022-11-02 DIAGNOSIS — M79671 Pain in right foot: Secondary | ICD-10-CM | POA: Diagnosis not present

## 2022-11-02 DIAGNOSIS — R262 Difficulty in walking, not elsewhere classified: Secondary | ICD-10-CM | POA: Diagnosis not present

## 2022-11-07 ENCOUNTER — Ambulatory Visit (INDEPENDENT_AMBULATORY_CARE_PROVIDER_SITE_OTHER): Payer: BC Managed Care – PPO | Admitting: Podiatry

## 2022-11-07 ENCOUNTER — Ambulatory Visit (INDEPENDENT_AMBULATORY_CARE_PROVIDER_SITE_OTHER): Payer: BC Managed Care – PPO

## 2022-11-07 DIAGNOSIS — M21611 Bunion of right foot: Secondary | ICD-10-CM | POA: Diagnosis not present

## 2022-11-07 DIAGNOSIS — M21619 Bunion of unspecified foot: Secondary | ICD-10-CM

## 2022-11-09 ENCOUNTER — Encounter: Payer: Self-pay | Admitting: Podiatry

## 2022-11-09 NOTE — Progress Notes (Signed)
Chief Complaint  Patient presents with   Routine Post Op    POV #5 DOS 08/02/22 --- RIGHT FOOT SURGICAL CORRECTION OF BUNION(AUSTIN BUNIONECTOMY).  Patient is concerned that he hallux is starting to turn inward toward the second two.     HPI: 57 y.o. female presenting today approximately 14 weeks status post right bunionectomy with osteotomy utilizing internal fixation.  Patient notes that she is not having any pain at this time.  He is overall pleased with the outcome of her procedure by Dr. Ardelle Anton.  She thinks the hallux has shifted slightly towards the second toe.  Denies injury since last seen.  Past Medical History:  Diagnosis Date   Abnormal Pap smear of cervix    in late 20's   Childhood asthma    Colon polyps    Diabetes mellitus without complication (HCC)    type 2   Family history of malignant neoplasm of gastrointestinal tract    Family history of polyps in the colon    Hyperlipidemia    Hypertension    Shingles     Past Surgical History:  Procedure Laterality Date   APPENDECTOMY     cecum removal     CERVICAL CONE BIOPSY     abnormal pap smear   CHOLECYSTECTOMY     COLONOSCOPY     2014 and 2019 and numerous over the years due to [facility]   METATARSAL OSTEOTOMY WITH BUNIONECTOMY Right 08/02/2022   TONSILLECTOMY AND ADENOIDECTOMY      Allergies  Allergen Reactions   Nonoxynol-9      Physical Exam:  General: The patient is alert and oriented x3 in no acute distress.  Dermatology: Skin is warm, dry and supple bilateral lower extremities. Interspaces are clear of maceration and debris.  Minimal localized edema to the medial aspect of the first MPJ right foot.  Scar shows no signs of keloid formation or hypertrophic scarring  Vascular: Palpable pedal pulses bilaterally. Capillary refill within normal limits.  No erythema or calor.  Neurological: Light touch sensation grossly intact bilateral feet.   Musculoskeletal Exam: There is no pain on palpation of  the right first metatarsal phalangeal joint.  The first ray is in rectus position.  No crepitus on range of motion of the first metatarsophalangeal joint.  Radiographic Exam (right foot, 3 views, 11/07/2022):  Decreased osseous mineralization noted throughout the entire foot.  Osteotomy site shows good radiographic signs of healing.  The screw is intact and shows no signs of position change in the first metatarsal.  Joint is congruous at the first metatarsophalangeal joint.  Assessment/Plan of Care: 1. Bunion     Discussed clinical and radiographic findings with patient today.  Patient was instructed to massage the surgical area and may use a golf ball to massage the area near the abductor hallucis muscle since she has been getting some discomfort in that area.  She also tries to isolate and work that muscle to keep the great toe from drifting towards the second.  She was dispensed a toe spacer today that she can wear during waking hours when in shoe gear.  Patient will be discharged at this time.  She may follow-up if any changes are noted or symptoms begin to develop in the area.   Clerance Lav, DPM, FACFAS [facility]     [address]  [place], [place] [address]                Office [phone]  Fax [phone]

## 2022-12-24 ENCOUNTER — Encounter: Payer: Self-pay | Admitting: Family Medicine

## 2022-12-25 ENCOUNTER — Other Ambulatory Visit: Payer: Self-pay | Admitting: *Deleted

## 2022-12-25 DIAGNOSIS — E1169 Type 2 diabetes mellitus with other specified complication: Secondary | ICD-10-CM

## 2022-12-25 DIAGNOSIS — E1159 Type 2 diabetes mellitus with other circulatory complications: Secondary | ICD-10-CM

## 2022-12-25 DIAGNOSIS — E1165 Type 2 diabetes mellitus with hyperglycemia: Secondary | ICD-10-CM

## 2022-12-25 NOTE — Telephone Encounter (Signed)
Please advise 

## 2022-12-25 NOTE — Telephone Encounter (Signed)
Ok to place future labs.  April Clayton. Jimmey Ralph, MD 12/25/2022 10:13 AM

## 2022-12-28 ENCOUNTER — Other Ambulatory Visit (INDEPENDENT_AMBULATORY_CARE_PROVIDER_SITE_OTHER): Payer: BC Managed Care – PPO

## 2022-12-28 DIAGNOSIS — E1165 Type 2 diabetes mellitus with hyperglycemia: Secondary | ICD-10-CM

## 2022-12-28 DIAGNOSIS — E1169 Type 2 diabetes mellitus with other specified complication: Secondary | ICD-10-CM

## 2022-12-28 DIAGNOSIS — E785 Hyperlipidemia, unspecified: Secondary | ICD-10-CM

## 2022-12-28 DIAGNOSIS — Z7985 Long-term (current) use of injectable non-insulin antidiabetic drugs: Secondary | ICD-10-CM | POA: Diagnosis not present

## 2022-12-28 LAB — HEMOGLOBIN A1C: Hgb A1c MFr Bld: 5.8 % (ref 4.6–6.5)

## 2022-12-28 LAB — LIPID PANEL
Cholesterol: 200 mg/dL (ref 0–200)
HDL: 36.5 mg/dL — ABNORMAL LOW (ref >39.00)
NonHDL: 163.85
Total CHOL/HDL Ratio: 5
Triglycerides: 247 mg/dL — ABNORMAL HIGH (ref 0.0–149.0)
VLDL: 49.4 mg/dL — ABNORMAL HIGH (ref 0.0–40.0)

## 2022-12-28 LAB — LDL CHOLESTEROL, DIRECT: Direct LDL: 122 mg/dL

## 2023-01-01 ENCOUNTER — Ambulatory Visit (INDEPENDENT_AMBULATORY_CARE_PROVIDER_SITE_OTHER): Payer: BC Managed Care – PPO | Admitting: Family Medicine

## 2023-01-01 ENCOUNTER — Encounter: Payer: Self-pay | Admitting: Family Medicine

## 2023-01-01 VITALS — BP 112/69 | HR 89 | Temp 97.8°F | Ht 67.0 in | Wt 184.4 lb

## 2023-01-01 DIAGNOSIS — E785 Hyperlipidemia, unspecified: Secondary | ICD-10-CM | POA: Diagnosis not present

## 2023-01-01 DIAGNOSIS — I152 Hypertension secondary to endocrine disorders: Secondary | ICD-10-CM

## 2023-01-01 DIAGNOSIS — E1165 Type 2 diabetes mellitus with hyperglycemia: Secondary | ICD-10-CM

## 2023-01-01 DIAGNOSIS — E1159 Type 2 diabetes mellitus with other circulatory complications: Secondary | ICD-10-CM

## 2023-01-01 DIAGNOSIS — E1169 Type 2 diabetes mellitus with other specified complication: Secondary | ICD-10-CM

## 2023-01-01 MED ORDER — ATORVASTATIN CALCIUM 20 MG PO TABS: 20.0000 mg | ORAL_TABLET | Freq: Every day | ORAL | 2 refills | Status: DC

## 2023-01-01 NOTE — Patient Instructions (Signed)
It was very nice to see you today!  I will refill Lipitor.  No other changes today.  Please continue to work on diet and exercise.  Return in about 6 months (around 07/04/2023) for Follow Up.   Take care, Dr Jimmey Ralph  PLEASE NOTE:  If you had any lab tests, please let us know if you have not heard back within a few days. You may see your results on mychart before we have a chance to review them but we will give you a call once they are reviewed by [place].   If we ordered any referrals today, please let us know if you have not heard from their office within the next week.   If you had any urgent prescriptions sent in today, please check with the pharmacy within an hour of our visit to make sure the prescription was transmitted appropriately.   Please try these tips to maintain a healthy lifestyle:  Eat at least 3 REAL meals and 1-2 snacks per day.  Aim for no more than 5 hours between eating.  If you eat breakfast, please do so within one hour of getting up.   Each meal should contain half fruits/vegetables, one quarter protein, and one quarter carbs (no bigger than a computer mouse)  Cut down on sweet beverages. This includes juice, soda, and sweet tea.   Drink at least 1 glass of water with each meal and aim for at least 8 glasses per day  Exercise at least 150 minutes every week.

## 2023-01-01 NOTE — Progress Notes (Signed)
Her A1c up a little bit to 5.8.  This is still at goal.  We can discuss medication adjustments at her appointment later this week.  Her cholesterol levels are elevated.  Will discuss this at her office visit.

## 2023-01-01 NOTE — Assessment & Plan Note (Signed)
Last LDL and total cholesterol elevated however she has been out of meds for the last several weeks.  Will restart Lipitor 20 mg daily today.  Previously was tolerating well.  We also discussed lifestyle modifications.  We can recheck in 6 months.

## 2023-01-01 NOTE — Assessment & Plan Note (Signed)
Last A1c 5.8.  She is on Mounjaro 7.5 mg weekly.  Tolerating well.  Recheck again in 6 months.  She will be going [place] a hike [place] next [place] and is considering temporarily stopping medications on this.  We can discuss more next office visit.

## 2023-01-01 NOTE — Progress Notes (Signed)
   April Clayton is a 57 y.o. female who presents today for an office visit.  Assessment/Plan:  Chronic Problems Addressed Today: Hypertension associated with diabetes (HCC) Blood pressure at goal on valsartan 160 mg daily.  Type 2 diabetes mellitus with hyperglycemia, without long-term current use of insulin (HCC) Last A1c 5.8.  She is on Mounjaro 7.5 mg weekly.  Tolerating well.  Recheck again in 6 months.  She will be going [place] a hike [place] next year and is considering temporarily stopping medications on this.  We can discuss more next office visit.  Dyslipidemia associated with type 2 diabetes mellitus (HCC) Last LDL and total cholesterol elevated however she has been out of meds for the last several weeks.  Will restart Lipitor 20 mg daily today.  Previously was tolerating well.  We also discussed lifestyle modifications.  We can recheck in 6 months.     Subjective:  HPI:  See A/P for status of chronic conditions.  Patient is here today for follow-up.  She was seen here 6 months ago.  At that time we checked A1c which was 5.4.  Continue Mounjaro 7.5 mg weekly and stopped metformin.  She did have labs last week which showed A1c of 5.8.  Her cholesterol was also checked last week.  Total cholesterol up to 200 from 135.  LDL up to 122. She has been out of her lipotor for the last couple of months.        Objective:  Physical Exam: BP 112/69   Pulse 89   Temp 97.8 F (36.6 C) (Temporal)   Ht 5\' 7"  (1.702 m)   Wt 184 lb 6.4 oz (83.6 kg)   SpO2 97%   BMI 28.88 kg/m   Wt Readings from Last 3 Encounters:  01/01/23 184 lb 6.4 oz (83.6 kg)  06/06/22 185 lb 6.4 oz (84.1 kg)  01/02/22 189 lb 3.2 oz (85.8 kg)    Gen: No acute distress, resting comfortably CV: Regular rate and rhythm with no murmurs appreciated Pulm: Normal work of breathing, clear to auscultation bilaterally with no crackles, wheezes, or rhonchi Neuro: Grossly normal, moves all  extremities Psych: Normal affect and thought content      Brookley Spitler M. Jimmey Ralph, MD 01/01/2023 10:31 AM

## 2023-01-01 NOTE — Assessment & Plan Note (Signed)
Blood pressure at goal on valsartan 160 mg daily.

## 2023-01-03 NOTE — Addendum Note (Signed)
Addended by: Dyann Kief on: 01/03/2023 01:05 PM   Modules accepted: Orders

## 2023-02-22 DIAGNOSIS — H5213 Myopia, bilateral: Secondary | ICD-10-CM | POA: Diagnosis not present

## 2023-02-22 DIAGNOSIS — E119 Type 2 diabetes mellitus without complications: Secondary | ICD-10-CM | POA: Diagnosis not present

## 2023-02-22 DIAGNOSIS — H2513 Age-related nuclear cataract, bilateral: Secondary | ICD-10-CM | POA: Diagnosis not present

## 2023-02-22 LAB — HM DIABETES EYE EXAM

## 2023-03-16 ENCOUNTER — Other Ambulatory Visit: Payer: Self-pay | Admitting: *Deleted

## 2023-03-16 MED ORDER — VALSARTAN 160 MG PO TABS: 160.0000 mg | ORAL_TABLET | Freq: Every day | ORAL | 1 refills | Status: DC

## 2023-03-22 DIAGNOSIS — L578 Other skin changes due to chronic exposure to nonionizing radiation: Secondary | ICD-10-CM | POA: Diagnosis not present

## 2023-03-22 DIAGNOSIS — D1801 Hemangioma of skin and subcutaneous tissue: Secondary | ICD-10-CM | POA: Diagnosis not present

## 2023-03-22 DIAGNOSIS — L814 Other melanin hyperpigmentation: Secondary | ICD-10-CM | POA: Diagnosis not present

## 2023-03-22 DIAGNOSIS — Z808 Family history of malignant neoplasm of other organs or systems: Secondary | ICD-10-CM | POA: Diagnosis not present

## 2023-05-07 ENCOUNTER — Encounter: Payer: Self-pay | Admitting: Obstetrics and Gynecology

## 2023-05-07 ENCOUNTER — Other Ambulatory Visit (HOSPITAL_COMMUNITY)
Admission: RE | Admit: 2023-05-07 | Discharge: 2023-05-07 | Disposition: A | Discharge status: Home / Self Care | Payer: BC Managed Care – PPO | Source: Ambulatory Visit | Attending: Obstetrics and Gynecology | Admitting: Obstetrics and Gynecology

## 2023-05-07 ENCOUNTER — Ambulatory Visit (INDEPENDENT_AMBULATORY_CARE_PROVIDER_SITE_OTHER): Payer: BC Managed Care – PPO | Admitting: Obstetrics and Gynecology

## 2023-05-07 VITALS — BP 120/80 | HR 88 | Ht 66.25 in | Wt 181.0 lb

## 2023-05-07 DIAGNOSIS — E2839 Other primary ovarian failure: Secondary | ICD-10-CM

## 2023-05-07 DIAGNOSIS — Z01419 Encounter for gynecological examination (general) (routine) without abnormal findings: Secondary | ICD-10-CM

## 2023-05-07 DIAGNOSIS — Z1331 Encounter for screening for depression: Secondary | ICD-10-CM | POA: Diagnosis not present

## 2023-05-07 NOTE — Progress Notes (Signed)
57 y.o. y.o. female here for annual exam. No LMP recorded. Patient is postmenopausal. 10 years ago. Report loss of height No HRT use   No LMP recorded. Patient is postmenopausal.  ER nurse, loves hiking. Health Maintenance: Pap: 2018 normal History of abnormal Pap:  Yes, [facility] 26 years ago MMG:  07/13/22 birads 1 Colonoscopy: 2019 f/u 2022 BMD:   none TDaP:  2017 Gardasil:   n/a Covid-19: pfizer Pneumonia vaccine(s):  n/a Shingrix:   Not done Hep C testing: not done Screening Labs: with PCP Body mass index is 28.99 kg/m.     05/07/2023   10:15 AM 01/01/2023   10:06 AM 06/06/2022    9:21 AM  Depression screen PHQ 2/9  Decreased Interest 0 0 0  Down, Depressed, Hopeless 0 0 0  PHQ - 2 Score 0 0 0    Blood pressure 120/80, pulse 88, height 5' 6.25" (1.683 m), weight 181 lb (82.1 kg), SpO2 98%.     Component Value Date/Time   DIAGPAP  05/03/2020 1147    - Negative for intraepithelial lesion or malignancy (NILM)   HPVHIGH Negative 05/03/2020 1147   ADEQPAP  05/03/2020 1147    Satisfactory for evaluation; transformation zone component PRESENT.    GYN HISTORY:    Component Value Date/Time   DIAGPAP  05/03/2020 1147    - Negative for intraepithelial lesion or malignancy (NILM)   HPVHIGH Negative 05/03/2020 1147   ADEQPAP  05/03/2020 1147    Satisfactory for evaluation; transformation zone component PRESENT.    OB History  Gravida Para Term Preterm AB Living  1         1  SAB IAB Ectopic Multiple Live Births          1    # Outcome Date GA Lbr Len/2nd Weight Sex Type Anes PTL Lv  1 Gravida             Past Medical History:  Diagnosis Date   Abnormal Pap smear of cervix    in late 20's   Childhood asthma    Colon polyps    Diabetes mellitus without complication (HCC)    type 2   Family history of malignant neoplasm of gastrointestinal tract    Family history of polyps in the colon    Hyperlipidemia    Hypertension    Shingles     Past Surgical  History:  Procedure Laterality Date   APPENDECTOMY     cecum removal     CERVICAL CONE BIOPSY     abnormal pap smear   CHOLECYSTECTOMY     COLONOSCOPY     2014 and 2019 and numerous over the years due to [facility]   METATARSAL OSTEOTOMY WITH BUNIONECTOMY Right 08/02/2022   TONSILLECTOMY AND ADENOIDECTOMY      Current Outpatient Medications on File Prior to Visit  Medication Sig Dispense Refill   atorvastatin (LIPITOR) 20 MG tablet Take 1 tablet (20 mg total) by mouth daily. 90 tablet 2   B Complex Vitamins (B COMPLEX PO) Take by mouth.     cetirizine (ZYRTEC) 10 MG tablet Take 10 mg by mouth daily.     cholecalciferol (VITAMIN D3) 25 MCG (1000 UNIT) tablet Take 1,000 Units by mouth daily.     Cyanocobalamin (B-12 PO) Take by mouth.     tirzepatide (MOUNJARO) 7.5 MG/0.5ML Pen Inject 7.5 mg into the skin once a week. 6 mL 3   valsartan (DIOVAN) 160 MG tablet Take 1 tablet (160 mg  total) by mouth daily. 90 tablet 1   No current facility-administered medications on file prior to visit.    Social History   Socioeconomic History   Marital status: Married    Spouse name: Not on file   Number of children: Not on file   Years of education: Not on file   Highest education level: Bachelor's degree (e.g., BA, AB, BS)  Occupational History   Not on file  Tobacco Use   Smoking status: Never   Smokeless tobacco: Never  Vaping Use   Vaping status: Never Used  Substance and Sexual Activity   Alcohol use: Yes    Comment: occasionally   Drug use: Never   Sexual activity: Yes    Partners: Male    Birth control/protection: Post-menopausal  Other Topics Concern   Not on file  Social History Narrative   Not on file   Social Determinants of Health   Financial Resource Strain: Low Risk  (12/28/2022)   Overall Financial Resource Strain (CARDIA)    Difficulty of Paying Living Expenses: Not hard at all  Food Insecurity: No Food Insecurity (12/28/2022)   Hunger Vital Sign    Worried About  Running Out of Food in the Last Year: Never true    Ran Out of Food in the Last Year: Never true  Transportation Needs: No Transportation Needs (12/28/2022)   PRAPARE - Administrator, Civil Service (Medical): No    Lack of Transportation (Non-Medical): No  Physical Activity: Sufficiently Active (12/28/2022)   Exercise Vital Sign    Days of Exercise per Week: 5 days    Minutes of Exercise per Session: 70 min  Stress: No Stress Concern Present (12/28/2022)   [employer] of Occupational Health - Occupational Stress Questionnaire    Feeling of Stress : Only a little  Social Connections: Unknown (12/28/2022)   Social Connection and Isolation Panel [NHANES]    Frequency of Communication with Friends and Family: More than three times a week    Frequency of Social Gatherings with Friends and Family: More than three times a week    Attends Religious Services: 1 to 4 times per year    Active Member of [employer] or Organizations: Patient declined    Attends Engineer, structural: Not on file    Marital Status: Married  Catering manager Violence: Not on file    Family History  Problem Relation Age of Onset   Hypertension Mother    Colon polyps Mother    Hypertension Father    Heart disease Father    Pulmonary fibrosis Father    Diabetes Brother    Hypertension Brother    Colon cancer Maternal Uncle 72   Colon cancer Maternal Grandmother 20       d. 37   Hypertension Maternal Grandmother    Hypertension Maternal Grandfather    Diabetes Paternal Grandmother    Hypertension Paternal Grandmother    Stroke Paternal Grandmother    Hypertension Paternal Grandfather    Colon cancer Other 20       MGMs sister   Breast cancer Other 30       MGMs sister     Allergies  Allergen Reactions   Nonoxynol-9       Patient's last menstrual period was No LMP recorded. Patient is postmenopausal..             Review of Systems Alls systems reviewed and are negative.      Physical Exam Constitutional:  Appearance: Normal appearance.  Genitourinary:     Vulva and urethral meatus normal.     No lesions in the vagina.     Right Labia: No rash, lesions or skin changes.    Left Labia: No lesions, skin changes or rash.    No vaginal discharge or tenderness.     No vaginal prolapse present.    No vaginal atrophy present.     Right Adnexa: not tender, not palpable and no mass present.    Left Adnexa: not tender, not palpable and no mass present.    No cervical motion tenderness or discharge.     Uterus is not enlarged, tender or irregular.  Breasts:    Right: Normal.     Left: Normal.  HENT:     Head: Normocephalic.  Neck:     Thyroid: No thyroid mass, thyromegaly or thyroid tenderness.  Cardiovascular:     Rate and Rhythm: Normal rate and regular rhythm.     Heart sounds: Normal heart sounds, S1 normal and S2 normal.  Pulmonary:     Effort: Pulmonary effort is normal.     Breath sounds: Normal breath sounds and air entry.  Abdominal:     General: There is no distension.     Palpations: Abdomen is soft. There is no mass.     Tenderness: There is no abdominal tenderness. There is no guarding or rebound.  Musculoskeletal:        General: Normal range of motion.     Cervical back: Full passive range of motion without pain, normal range of motion and neck supple. No tenderness.     Right lower leg: No edema.     Left lower leg: No edema.  Neurological:     Mental Status: She is alert.  Skin:    General: Skin is warm.  Psychiatric:        Mood and Affect: Mood normal.        Behavior: Behavior normal.        Thought Content: Thought content normal.  Vitals and nursing note reviewed. Exam conducted with a chaperone present.       A:         Well Woman GYN exam                             P:        Pap smear collected today Encouraged annual mammogram screening Colon cancer screening up-to-date DXA ordered today Labs and immunizations  to do with PMD Encouraged healthy lifestyle practices Encouraged Vit D and Calcium   No follow-ups on file.  Earley Favor

## 2023-05-08 LAB — CYTOLOGY - PAP
Comment: NEGATIVE
Diagnosis: NEGATIVE
High risk HPV: NEGATIVE

## 2023-06-19 ENCOUNTER — Telehealth: Payer: Self-pay | Admitting: *Deleted

## 2023-06-19 NOTE — Telephone Encounter (Signed)
Ok to order same labs as last time.s  Katina Degree. Jimmey Ralph, MD 06/19/2023 1:09 PM

## 2023-06-19 NOTE — Telephone Encounter (Signed)
Copied from CRM [id number]. Topic: Clinical - Request for Lab/Test Order >> Jun 19, 2023 10:12 AM Corin V wrote: Reason for CRM: Patient is calling in to see if Dr. Jimmey Ralph wants her to complete any labs prior to her visit on 07/05/23. Please order labs if desired and call patient to schedule. If no labs required, please let patient know.

## 2023-06-21 ENCOUNTER — Other Ambulatory Visit: Payer: Self-pay | Admitting: *Deleted

## 2023-06-21 ENCOUNTER — Other Ambulatory Visit: Payer: BC Managed Care – PPO

## 2023-06-21 DIAGNOSIS — Z0000 Encounter for general adult medical examination without abnormal findings: Secondary | ICD-10-CM

## 2023-06-21 DIAGNOSIS — E1165 Type 2 diabetes mellitus with hyperglycemia: Secondary | ICD-10-CM

## 2023-06-21 DIAGNOSIS — E1169 Type 2 diabetes mellitus with other specified complication: Secondary | ICD-10-CM

## 2023-06-21 NOTE — Telephone Encounter (Signed)
Please schedule lab visit before 07/05/2023 Let patient know Future lab order

## 2023-06-22 ENCOUNTER — Other Ambulatory Visit: Payer: BC Managed Care – PPO

## 2023-06-24 ENCOUNTER — Other Ambulatory Visit: Payer: Self-pay | Admitting: Family Medicine

## 2023-06-25 ENCOUNTER — Other Ambulatory Visit: Payer: BC Managed Care – PPO

## 2023-07-03 ENCOUNTER — Other Ambulatory Visit (INDEPENDENT_AMBULATORY_CARE_PROVIDER_SITE_OTHER): Payer: BC Managed Care – PPO

## 2023-07-03 DIAGNOSIS — Z0000 Encounter for general adult medical examination without abnormal findings: Secondary | ICD-10-CM

## 2023-07-03 DIAGNOSIS — E1165 Type 2 diabetes mellitus with hyperglycemia: Secondary | ICD-10-CM

## 2023-07-03 LAB — CBC
HCT: 43.3 % (ref 36.0–46.0)
Hemoglobin: 14.2 g/dL (ref 12.0–15.0)
MCHC: 32.8 g/dL (ref 30.0–36.0)
MCV: 91.8 fL (ref 78.0–100.0)
Platelets: 229 10*3/uL (ref 150.0–400.0)
RBC: 4.72 Mil/uL (ref 3.87–5.11)
RDW: 13.7 % (ref 11.5–15.5)
WBC: 8 10*3/uL (ref 4.0–10.5)

## 2023-07-03 LAB — COMPREHENSIVE METABOLIC PANEL
ALT: 26 U/L (ref 0–35)
AST: 25 U/L (ref 0–37)
Albumin: 4.5 g/dL (ref 3.5–5.2)
Alkaline Phosphatase: 78 U/L (ref 39–117)
BUN: 14 mg/dL (ref 6–23)
CO2: 26 meq/L (ref 19–32)
Calcium: 9.3 mg/dL (ref 8.4–10.5)
Chloride: 105 meq/L (ref 96–112)
Creatinine, Ser: 0.67 mg/dL (ref 0.40–1.20)
GFR: 96.85 mL/min (ref >60.00)
Glucose, Bld: 102 mg/dL — ABNORMAL HIGH (ref 70–99)
Potassium: 4 meq/L (ref 3.5–5.1)
Sodium: 140 meq/L (ref 135–145)
Total Bilirubin: 0.5 mg/dL (ref 0.2–1.2)
Total Protein: 7.6 g/dL (ref 6.0–8.3)

## 2023-07-03 LAB — HEMOGLOBIN A1C: Hgb A1c MFr Bld: 6.1 % (ref 4.6–6.5)

## 2023-07-03 LAB — TSH: TSH: 1.62 u[IU]/mL (ref 0.35–5.50)

## 2023-07-05 ENCOUNTER — Encounter: Payer: Self-pay | Admitting: Family Medicine

## 2023-07-05 ENCOUNTER — Ambulatory Visit: Payer: BC Managed Care – PPO | Admitting: Family Medicine

## 2023-07-05 NOTE — Progress Notes (Signed)
 Blood sugar borderline but stable.  Rest of her labs are all at goal.  We can recheck in 6 months.

## 2023-07-06 ENCOUNTER — Ambulatory Visit: Payer: BC Managed Care – PPO | Admitting: Family Medicine

## 2023-07-09 ENCOUNTER — Ambulatory Visit: Payer: BC Managed Care – PPO | Admitting: Family Medicine

## 2023-07-19 ENCOUNTER — Ambulatory Visit: Payer: BC Managed Care – PPO | Admitting: Family Medicine

## 2023-07-20 ENCOUNTER — Encounter: Payer: Self-pay | Admitting: Family Medicine

## 2023-07-20 ENCOUNTER — Ambulatory Visit: Payer: BC Managed Care – PPO | Admitting: Family Medicine

## 2023-07-20 ENCOUNTER — Other Ambulatory Visit: Payer: Self-pay | Admitting: *Deleted

## 2023-07-20 ENCOUNTER — Telehealth: Payer: Self-pay | Admitting: Family Medicine

## 2023-07-20 VITALS — BP 94/65 | HR 98 | Temp 97.4°F | Ht 66.0 in | Wt 179.0 lb

## 2023-07-20 DIAGNOSIS — E1165 Type 2 diabetes mellitus with hyperglycemia: Secondary | ICD-10-CM

## 2023-07-20 DIAGNOSIS — I152 Hypertension secondary to endocrine disorders: Secondary | ICD-10-CM

## 2023-07-20 DIAGNOSIS — Z1211 Encounter for screening for malignant neoplasm of colon: Secondary | ICD-10-CM

## 2023-07-20 DIAGNOSIS — F439 Reaction to severe stress, unspecified: Secondary | ICD-10-CM

## 2023-07-20 DIAGNOSIS — E1159 Type 2 diabetes mellitus with other circulatory complications: Secondary | ICD-10-CM

## 2023-07-20 DIAGNOSIS — Z7985 Long-term (current) use of injectable non-insulin antidiabetic drugs: Secondary | ICD-10-CM | POA: Diagnosis not present

## 2023-07-20 DIAGNOSIS — E1169 Type 2 diabetes mellitus with other specified complication: Secondary | ICD-10-CM

## 2023-07-20 DIAGNOSIS — Z0000 Encounter for general adult medical examination without abnormal findings: Secondary | ICD-10-CM

## 2023-07-20 LAB — MICROALBUMIN / CREATININE URINE RATIO
Creatinine,U: 130.1 mg/dL
Microalb Creat Ratio: 9.4 mg/g (ref 0.0–30.0)
Microalb, Ur: 1.2 mg/dL (ref 0.0–1.9)

## 2023-07-20 NOTE — Assessment & Plan Note (Signed)
Last A1c 6.1.  She is tolerating Mounjaro well.  Will continue 7.5 mg weekly.  Recheck in 6 months.  Discussed lifestyle modifications.

## 2023-07-20 NOTE — Patient Instructions (Signed)
It was very nice to see you today!  We will leave your current medications as they are.  I will refer you for colonoscopy.  Return in about 6 months (around 01/17/2024) for Annual Physical.   Take care, Dr Jimmey Ralph  PLEASE NOTE:  If you had any lab tests, please let us know if you have not heard back within a few days. You may see your results on mychart before we have a chance to review them but we will give you a call once they are reviewed by [place].   If we ordered any referrals today, please let us know if you have not heard from their office within the next week.   If you had any urgent prescriptions sent in today, please check with the pharmacy within an hour of our visit to make sure the prescription was transmitted appropriately.   Please try these tips to maintain a healthy lifestyle:  Eat at least 3 REAL meals and 1-2 snacks per day.  Aim for no more than 5 hours between eating.  If you eat breakfast, please do so within one hour of getting up.   Each meal should contain half fruits/vegetables, one quarter protein, and one quarter carbs (no bigger than a computer mouse)  Cut down on sweet beverages. This includes juice, soda, and sweet tea.   Drink at least 1 glass of water with each meal and aim for at least 8 glasses per day  Exercise at least 150 minutes every week.

## 2023-07-20 NOTE — Telephone Encounter (Signed)
Please schedule lab appt Future lab order

## 2023-07-20 NOTE — Assessment & Plan Note (Signed)
Blood pressure at goal on valsartan 160 mg daily.

## 2023-07-20 NOTE — Telephone Encounter (Signed)
 Ok with me. Please place any necessary orders.

## 2023-07-20 NOTE — Progress Notes (Signed)
   April Clayton is a 58 y.o. female who presents today for an office visit.  Assessment/Plan:  New/Acute Problems: Stress  Overall symptoms are manageable. A lot of stress due to caregiver burden from mothers recent health scares. We did discuss referral to therapist and medications however she would like to hold off on this for now. She will let us know if symptoms change or if she changes her mind.   Chronic Problems Addressed Today: Type 2 diabetes mellitus with hyperglycemia, without long-term current use of insulin (HCC) Last A1c 6.1.  She is tolerating Mounjaro well.  Will continue 7.5 mg weekly.  Recheck in 6 months.  Discussed lifestyle modifications.  Hypertension associated with diabetes (HCC) Blood pressure at goal on valsartan 160 mg daily.     Subjective:  HPI:  See Assessment / plan for status of chronic conditions.  She has been under more stress recently due to health situation with her mother. She does have good support at home.        Objective:  Physical Exam: BP 94/65   Pulse 98   Temp (!) 97.4 F (36.3 C) (Temporal)   Ht 5\' 6"  (1.676 m)   Wt 179 lb (81.2 kg)   SpO2 98%   BMI 28.89 kg/m   Gen: No acute distress, resting comfortably CV: Regular rate and rhythm with no murmurs appreciated Pulm: Normal work of breathing, clear to auscultation bilaterally with no crackles, wheezes, or rhonchi Neuro: Grossly normal, moves all extremities Psych: Normal affect and thought content      Adamary Savary M. Jimmey Ralph, MD 07/20/2023 2:17 PM

## 2023-07-20 NOTE — Telephone Encounter (Signed)
Pt requesting lab work prior to CPE on 01/18/24. Please advise

## 2023-07-20 NOTE — Telephone Encounter (Signed)
Ok to placed order? 

## 2023-07-23 ENCOUNTER — Encounter: Payer: Self-pay | Admitting: Family Medicine

## 2023-07-23 NOTE — Progress Notes (Signed)
 Urine sample is normal.  We can recheck in a year.

## 2023-07-30 NOTE — Telephone Encounter (Signed)
**Note De-identified  [place] Obfuscation** Please advise 

## 2023-07-31 NOTE — Telephone Encounter (Signed)
 Recommend they check with insurance to see if it is covered.  We can also check titers here first to see if they need the vaccine though also recommend they check with insurance to see if it is covered.

## 2023-09-20 ENCOUNTER — Other Ambulatory Visit: Payer: Self-pay | Admitting: Family Medicine

## 2023-09-24 ENCOUNTER — Other Ambulatory Visit (HOSPITAL_COMMUNITY): Payer: Self-pay

## 2023-09-24 DIAGNOSIS — Z800 Family history of malignant neoplasm of digestive organs: Secondary | ICD-10-CM | POA: Diagnosis not present

## 2023-09-24 DIAGNOSIS — Z860100 Personal history of colon polyps, unspecified: Secondary | ICD-10-CM | POA: Diagnosis not present

## 2023-09-24 DIAGNOSIS — Z1211 Encounter for screening for malignant neoplasm of colon: Secondary | ICD-10-CM | POA: Diagnosis not present

## 2023-09-24 DIAGNOSIS — K635 Polyp of colon: Secondary | ICD-10-CM | POA: Diagnosis not present

## 2023-09-24 DIAGNOSIS — D125 Benign neoplasm of sigmoid colon: Secondary | ICD-10-CM | POA: Diagnosis not present

## 2023-09-25 ENCOUNTER — Telehealth: Payer: Self-pay | Admitting: Pharmacy Technician

## 2023-09-25 NOTE — Telephone Encounter (Signed)
   Made me a follow up to do again on 10/04/23

## 2023-09-26 ENCOUNTER — Encounter: Payer: Self-pay | Admitting: Family Medicine

## 2023-09-26 NOTE — Telephone Encounter (Signed)
 See note

## 2023-09-27 NOTE — Telephone Encounter (Signed)
 Okay with me.  Please place any necessary orders.

## 2023-09-28 ENCOUNTER — Other Ambulatory Visit: Payer: Self-pay | Admitting: Family Medicine

## 2023-09-28 ENCOUNTER — Other Ambulatory Visit: Payer: Self-pay | Admitting: *Deleted

## 2023-09-28 DIAGNOSIS — Z8249 Family history of ischemic heart disease and other diseases of the circulatory system: Secondary | ICD-10-CM

## 2023-09-28 MED ORDER — DEXCOM G7 SENSOR MISC: 6 refills | Status: DC

## 2023-10-01 ENCOUNTER — Other Ambulatory Visit (HOSPITAL_COMMUNITY): Payer: Self-pay

## 2023-10-01 IMAGING — MG DIGITAL SCREENING BILAT W/ CAD
4 series · 4 of 4 positions shown · non-contrast
Comparison: Previous exam(s).

CLINICAL DATA: Screening.

EXAM:
DIGITAL SCREENING BILATERAL MAMMOGRAM WITH CAD
TECHNIQUE: Bilateral screening digital craniocaudal and mediolateral oblique
mammograms were obtained. The images were evaluated with
computer-aided detection.

[L MLO]
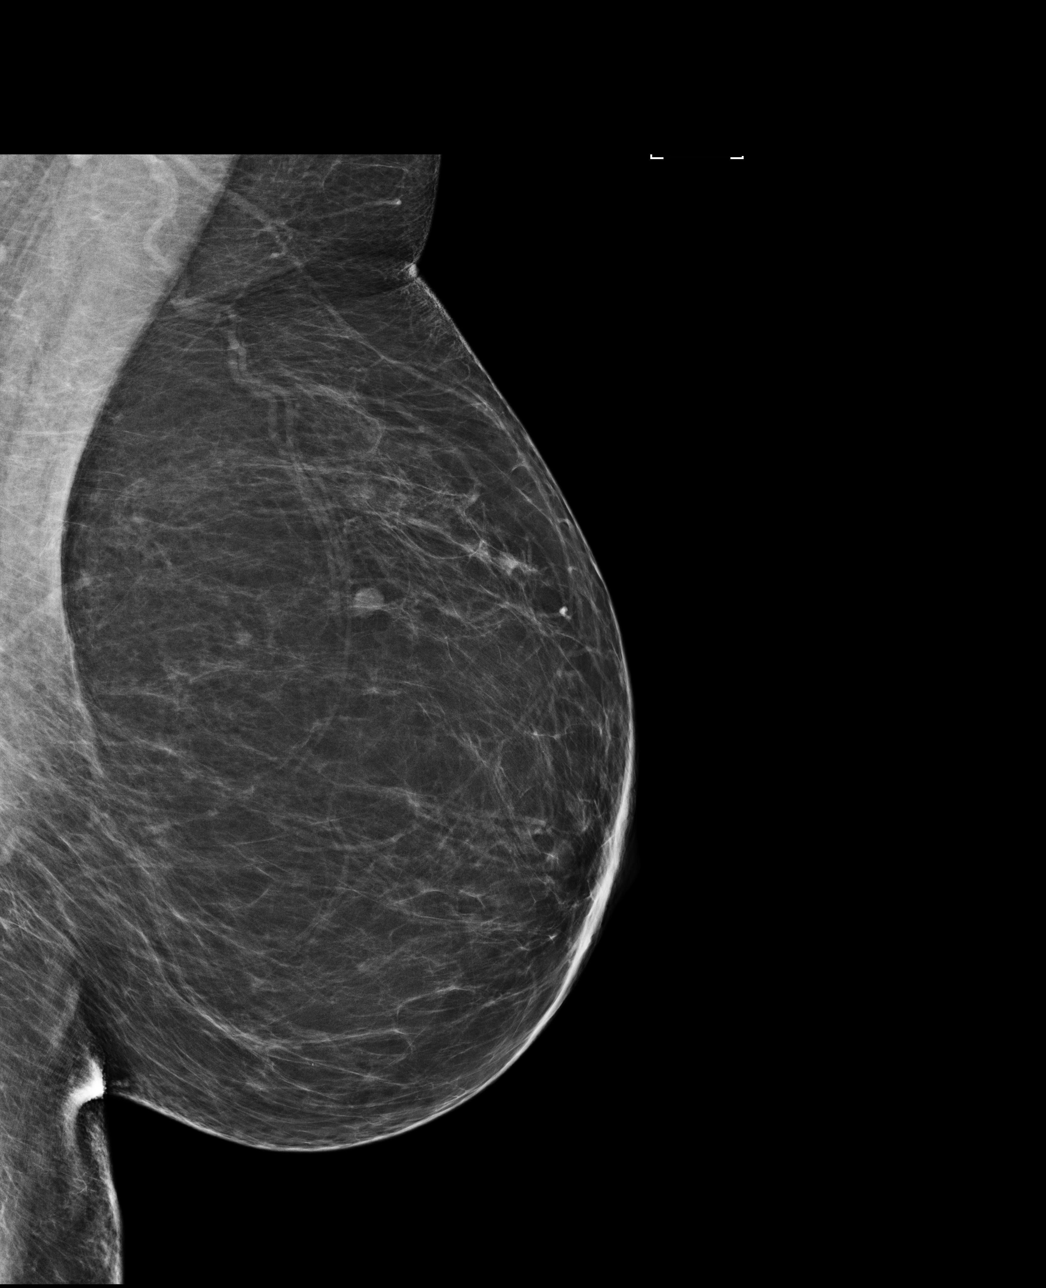

[L CC]
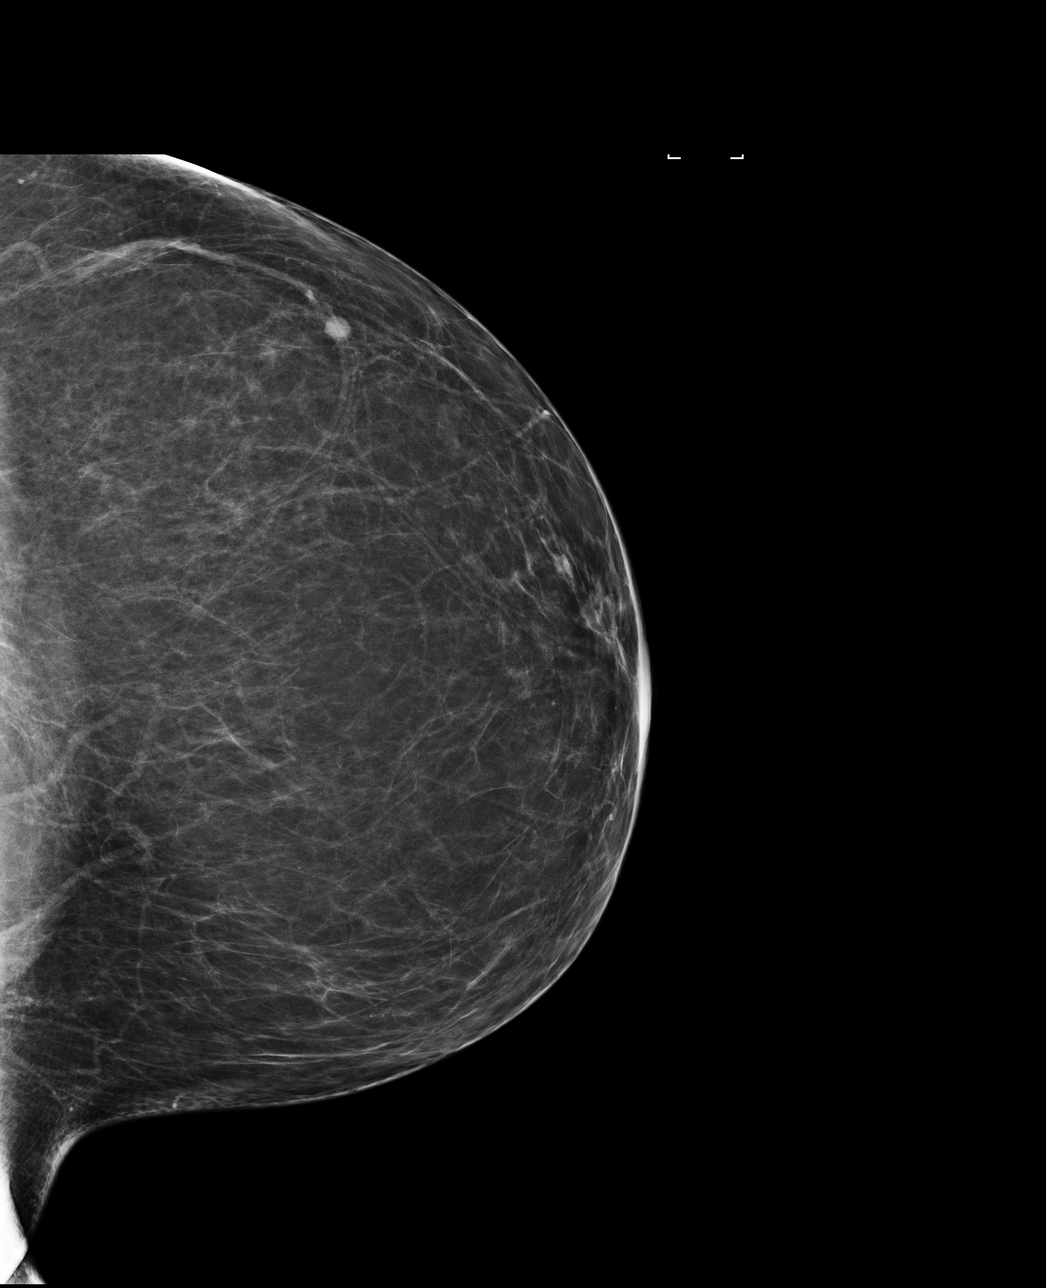

[R CC]
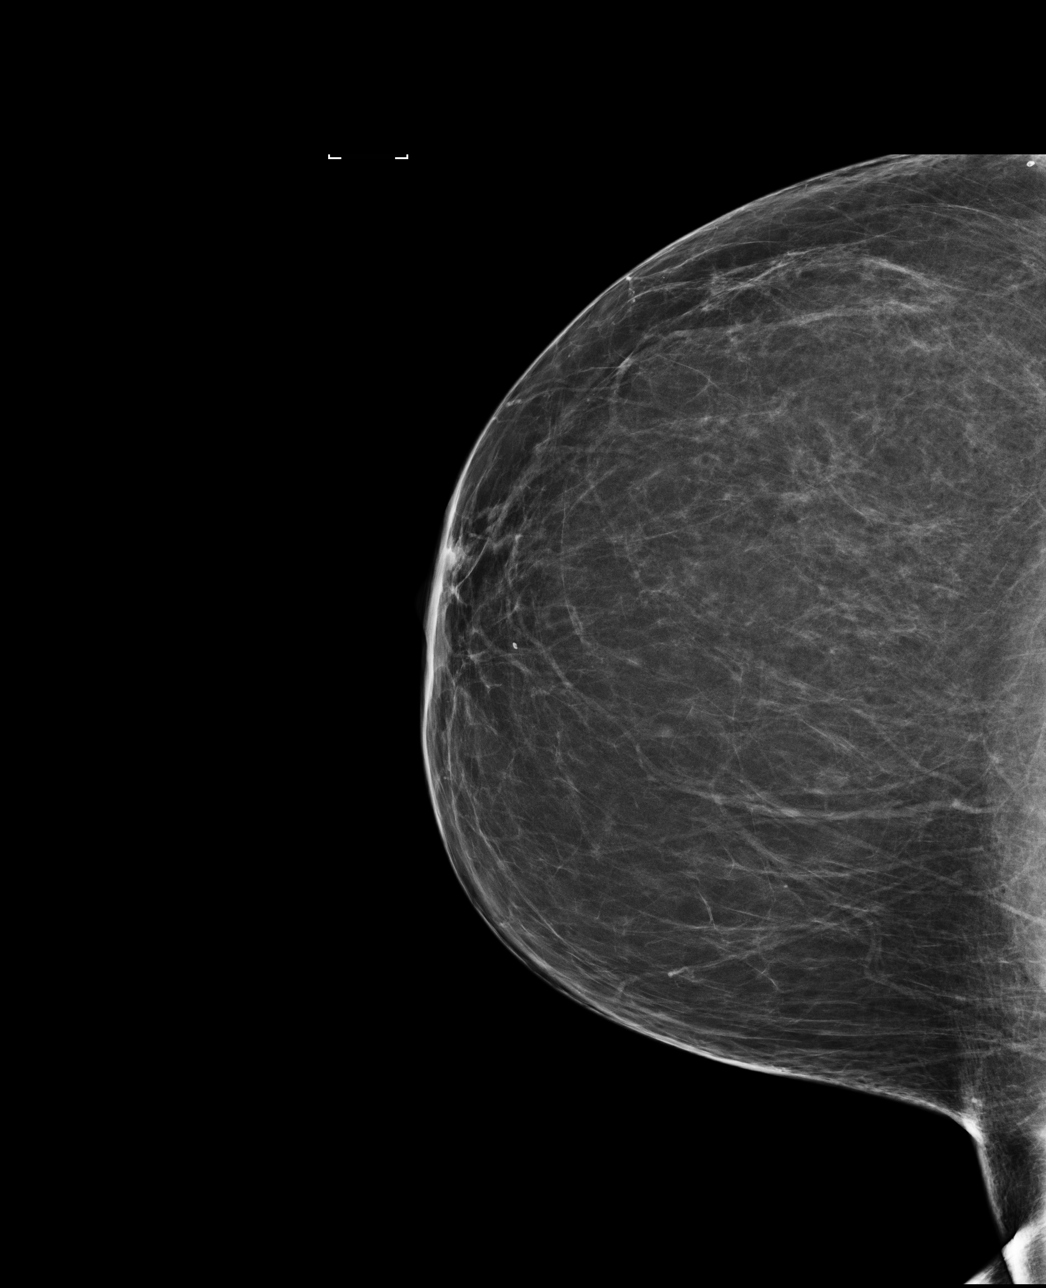

[R MLO]
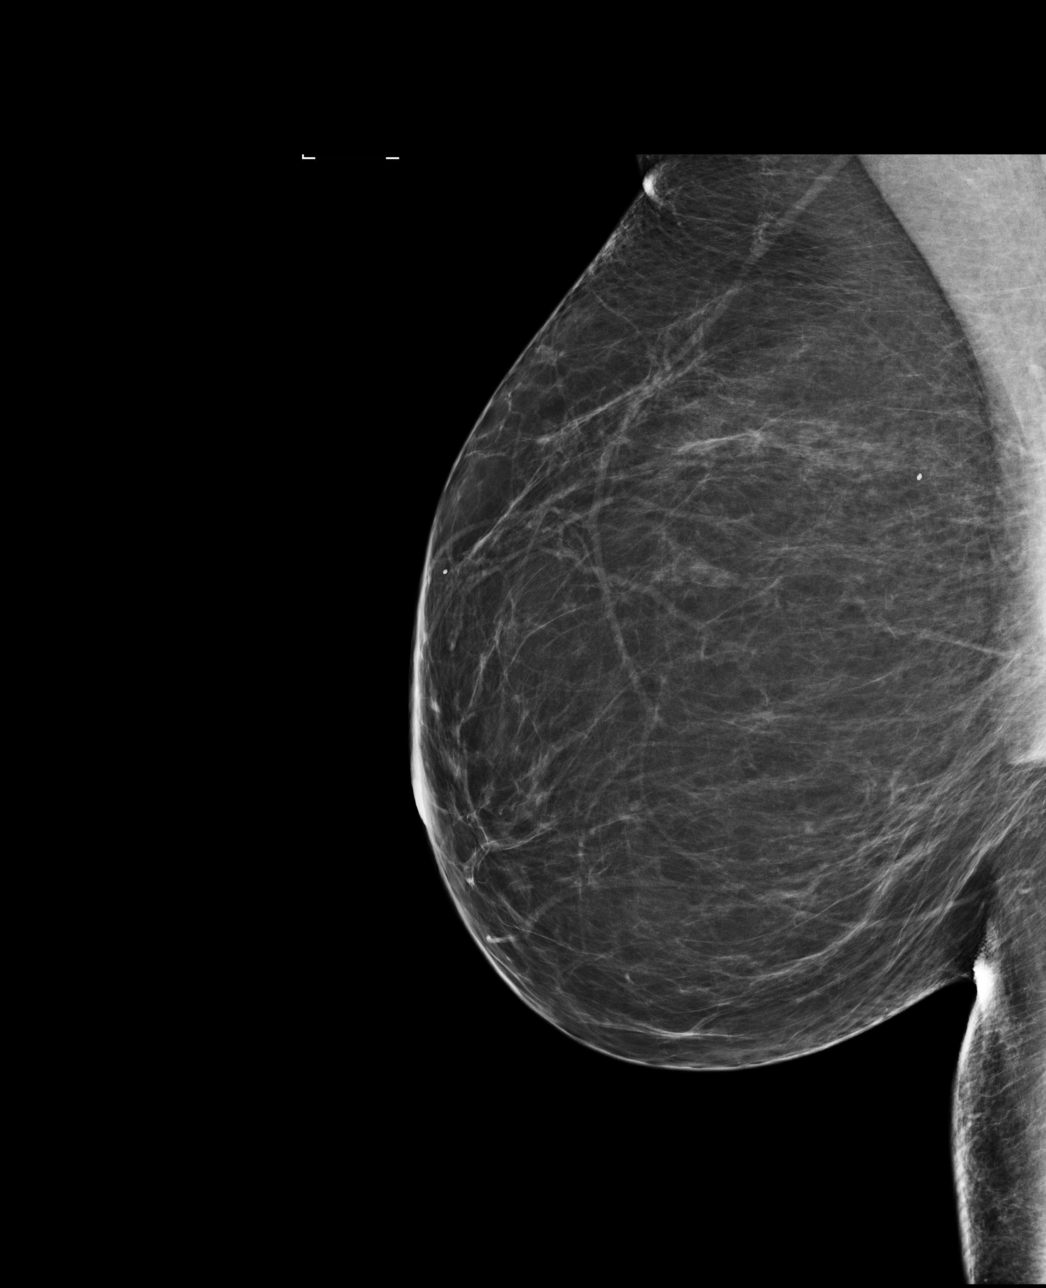

[4 of 4 positions shown; findings below may reference images not displayed]

ACR Breast Density Category b: There are scattered areas of
fibroglandular density.
FINDINGS: There are no findings suspicious for malignancy.
IMPRESSION: No mammographic evidence of malignancy. A result letter of this
screening mammogram will be mailed directly to the patient.

RECOMMENDATION:
Screening mammogram in one year. (Code:[id number])

BI-RADS CATEGORY  1: Negative.

## 2023-10-01 NOTE — Telephone Encounter (Signed)
 Type 2 diabetes mellitus with hyperglycemia, without long-term current use of insulin (HCC) [E11.65]  Please start a PA  Thanks

## 2023-10-01 NOTE — Telephone Encounter (Signed)
 Noted.

## 2023-10-04 ENCOUNTER — Other Ambulatory Visit (HOSPITAL_COMMUNITY): Payer: Self-pay

## 2023-10-04 NOTE — Telephone Encounter (Signed)
(  FILL ON 11/21/23) ; LAST FILLED 09/12/23 AT [facility]

## 2023-11-12 ENCOUNTER — Telehealth: Payer: Self-pay | Admitting: Obstetrics and Gynecology

## 2023-11-12 DIAGNOSIS — E2839 Other primary ovarian failure: Secondary | ICD-10-CM

## 2023-11-12 NOTE — Telephone Encounter (Signed)
 Dxa screening

## 2023-11-28 ENCOUNTER — Encounter: Payer: Self-pay | Admitting: Family Medicine

## 2023-11-28 ENCOUNTER — Encounter: Payer: Self-pay | Admitting: Obstetrics and Gynecology

## 2023-11-29 ENCOUNTER — Other Ambulatory Visit: Payer: Self-pay | Admitting: Family Medicine

## 2023-11-29 ENCOUNTER — Other Ambulatory Visit: Payer: Self-pay

## 2024-01-16 ENCOUNTER — Other Ambulatory Visit (INDEPENDENT_AMBULATORY_CARE_PROVIDER_SITE_OTHER)

## 2024-01-16 DIAGNOSIS — I152 Hypertension secondary to endocrine disorders: Secondary | ICD-10-CM | POA: Diagnosis not present

## 2024-01-16 DIAGNOSIS — E1169 Type 2 diabetes mellitus with other specified complication: Secondary | ICD-10-CM

## 2024-01-16 DIAGNOSIS — E785 Hyperlipidemia, unspecified: Secondary | ICD-10-CM | POA: Diagnosis not present

## 2024-01-16 DIAGNOSIS — E1159 Type 2 diabetes mellitus with other circulatory complications: Secondary | ICD-10-CM

## 2024-01-16 DIAGNOSIS — Z0000 Encounter for general adult medical examination without abnormal findings: Secondary | ICD-10-CM

## 2024-01-16 LAB — COMPREHENSIVE METABOLIC PANEL WITH GFR
ALT: 24 U/L (ref 0–35)
AST: 22 U/L (ref 0–37)
Albumin: 4.3 g/dL (ref 3.5–5.2)
Alkaline Phosphatase: 79 U/L (ref 39–117)
BUN: 18 mg/dL (ref 6–23)
CO2: 25 meq/L (ref 19–32)
Calcium: 9.1 mg/dL (ref 8.4–10.5)
Chloride: 104 meq/L (ref 96–112)
Creatinine, Ser: 0.69 mg/dL (ref 0.40–1.20)
GFR: 95.8 mL/min
Glucose, Bld: 115 mg/dL — ABNORMAL HIGH (ref 70–99)
Potassium: 3.9 meq/L (ref 3.5–5.1)
Sodium: 138 meq/L (ref 135–145)
Total Bilirubin: 0.5 mg/dL (ref 0.2–1.2)
Total Protein: 7.2 g/dL (ref 6.0–8.3)

## 2024-01-16 LAB — CBC
HCT: 41.7 % (ref 36.0–46.0)
Hemoglobin: 14.1 g/dL (ref 12.0–15.0)
MCHC: 33.7 g/dL (ref 30.0–36.0)
MCV: 90.1 fl (ref 78.0–100.0)
Platelets: 215 K/uL (ref 150.0–400.0)
RBC: 4.63 Mil/uL (ref 3.87–5.11)
RDW: 14 % (ref 11.5–15.5)
WBC: 7.5 K/uL (ref 4.0–10.5)

## 2024-01-16 LAB — LIPID PANEL
Cholesterol: 162 mg/dL (ref 0–200)
HDL: 38.3 mg/dL — ABNORMAL LOW (ref >39.00)
LDL Cholesterol: 57 mg/dL (ref 0–99)
NonHDL: 123.69
Total CHOL/HDL Ratio: 4
Triglycerides: 331 mg/dL — ABNORMAL HIGH (ref 0.0–149.0)
VLDL: 66.2 mg/dL — ABNORMAL HIGH (ref 0.0–40.0)

## 2024-01-16 LAB — HEMOGLOBIN A1C: Hgb A1c MFr Bld: 6.2 % (ref 4.6–6.5)

## 2024-01-16 LAB — TSH: TSH: 2.38 u[IU]/mL (ref 0.35–5.50)

## 2024-01-17 ENCOUNTER — Ambulatory Visit: Payer: Self-pay | Admitting: Family Medicine

## 2024-01-17 NOTE — Progress Notes (Signed)
 A1c 6.2 which is stable compared to previous values.  Her triglycerides are up a little bit however not at the point where we need to start meds for this.  LDL is well-controlled at 57.  The rest of her labs are all at goal.  We can discuss more at her upcoming office visit.

## 2024-01-18 ENCOUNTER — Encounter: Payer: Self-pay | Admitting: Family Medicine

## 2024-01-18 ENCOUNTER — Ambulatory Visit: Payer: BC Managed Care – PPO | Admitting: Family Medicine

## 2024-01-18 VITALS — BP 115/77 | HR 69 | Temp 97.3°F | Ht 66.0 in | Wt 181.6 lb

## 2024-01-18 DIAGNOSIS — E1165 Type 2 diabetes mellitus with hyperglycemia: Secondary | ICD-10-CM | POA: Diagnosis not present

## 2024-01-18 DIAGNOSIS — Z0001 Encounter for general adult medical examination with abnormal findings: Secondary | ICD-10-CM | POA: Diagnosis not present

## 2024-01-18 DIAGNOSIS — I152 Hypertension secondary to endocrine disorders: Secondary | ICD-10-CM

## 2024-01-18 DIAGNOSIS — E1159 Type 2 diabetes mellitus with other circulatory complications: Secondary | ICD-10-CM | POA: Diagnosis not present

## 2024-01-18 DIAGNOSIS — E785 Hyperlipidemia, unspecified: Secondary | ICD-10-CM

## 2024-01-18 DIAGNOSIS — Z7985 Long-term (current) use of injectable non-insulin antidiabetic drugs: Secondary | ICD-10-CM | POA: Diagnosis not present

## 2024-01-18 DIAGNOSIS — E1169 Type 2 diabetes mellitus with other specified complication: Secondary | ICD-10-CM

## 2024-01-18 DIAGNOSIS — K219 Gastro-esophageal reflux disease without esophagitis: Secondary | ICD-10-CM

## 2024-01-18 NOTE — Assessment & Plan Note (Signed)
 Blood pressure at goal today on valsartan 160 mg daily.

## 2024-01-18 NOTE — Assessment & Plan Note (Signed)
Stable on over-the-counter Pepcid as needed.

## 2024-01-18 NOTE — Assessment & Plan Note (Signed)
 Reviewed recent lipid panel.  LDL very well-controlled at 57.  Mildly elevated triglycerides.  Will continue Lipitor 20 mg daily.  Recheck in 6 to 12 months.

## 2024-01-18 NOTE — Assessment & Plan Note (Signed)
 We discussed most recent A1c 6.2.  She is on Mounjaro  7.5 mg weekly.  Tolerating well.  We discussed lifestyle modifications.  She is doing a very good job with diet and is working on getting back into hiking.

## 2024-01-18 NOTE — Progress Notes (Signed)
 Chief Complaint:  April Clayton is a 58 y.o. female who presents today for her annual comprehensive physical exam.    Assessment/Plan:  New/Acute Problems: Stress Patient with quite a bit of stress due to aging parent.  Overall symptoms are currently manageable.  We did discuss referral however she would like to hold off on this for now.  She will let us  know if she changes her mind.   Chronic Problems Addressed Today: Hypertension associated with diabetes (HCC) Blood pressure at goal today on valsartan  160 mg daily.  Type 2 diabetes mellitus with hyperglycemia, without long-term current use of insulin (HCC) We discussed most recent A1c 6.2.  She is on Mounjaro  7.5 mg weekly.  Tolerating well.  We discussed lifestyle modifications.  She is doing a very good job with diet and is working on getting back into hiking.  Dyslipidemia associated with type 2 diabetes mellitus (HCC) Reviewed recent lipid panel.  LDL very well-controlled at 57.  Mildly elevated triglycerides.  Will continue Lipitor 20 mg daily.  Recheck in 6 to 12 months.  GERD (gastroesophageal reflux disease) Stable on over-the-counter Pepcid as needed.  Preventative Healthcare: Up-to-date on vaccines and screenings.   Patient Counseling(The following topics were reviewed and/or handout was given):  -Nutrition: Stressed importance of moderation in sodium/caffeine intake, saturated fat and cholesterol, caloric balance, sufficient intake of fresh fruits, vegetables, and fiber.  -Stressed the importance of regular exercise.   -Substance Abuse: Discussed cessation/primary prevention of tobacco, alcohol, or other drug use; driving or other dangerous activities under the influence; availability of treatment for abuse.   -Injury prevention: Discussed safety belts, safety helmets, smoke detector, smoking near bedding or upholstery.   -Sexuality: Discussed sexually transmitted diseases, partner selection, use of condoms, avoidance  of unintended pregnancy and contraceptive alternatives.   -Dental health: Discussed importance of regular tooth brushing, flossing, and dental visits.  -Health maintenance and immunizations reviewed. Please refer to Health maintenance section.  Return to care in 1 year for next preventative visit.     Subjective:  HPI:  She has no acute complaints today. Patient is here today for his annual physical.  See assessment / plan for status of chronic conditions.  Discussed the use of AI scribe software for clinical note transcription with the patient, who gave verbal consent to proceed.  History of Present Illness April Clayton is a 58 year old female who presents for an annual physical exam.  She has noted elevated triglycerides in her recent lab results despite consuming a diet that includes yogurt with fruit, high protein, low sugar yogurt, and grilled meals, with occasional exceptions like spaghetti with hamburger. She is taking Lipitor and is attempting to increase her HDL.  She has not been walking regularly but completed a hike through [place]  on the [address], which she found beneficial for her blood sugar levels. During the hike, her blood sugar was well-controlled with the use of a continuous glucose monitor. She has gained approximately ten pounds since the hike, which she attributes to decreased physical activity.  She is experiencing significant stress due to her mother's health issues, including a second stroke following hernia surgery and suspected giant cell arteritis, leading to anxiety attacks and stress-related symptoms such as tightness and pain. She is taking doses of Pepcid daily to manage reflux symptoms, which she attributes to stress and possibly her medication, Mounjaro .  Her mother has been experiencing cognitive decline and mood changes, which she finds challenging to manage. Her mother is currently  living independently part-time, which has helped reduce her  stress. She plans to resume hiking in the fall to manage her stress and improve her physical health.  Her social history includes her son Max, who is leaving for [place] for graduate school, and her husband, who is undergoing hip surgery. She is managing her mother's medical appointments and care, which adds to her stress. She finds hiking to be the most effective way to cope with her stress and plans to continue with it.     Lifestyle Diet: Balanced. Plenty of fruits and vegetables. Avoiding processed foods.  Exercise: Hiking and walking.      01/18/2024   12:46 PM  Depression screen PHQ 2/9  Decreased Interest 0  Down, Depressed, Hopeless 0  PHQ - 2 Score 0    Health Maintenance Due  Topic Date Due   FOOT EXAM  02/08/2022     ROS: Per HPI, otherwise a complete review of systems was negative.   PMH:  The following were reviewed and entered/updated in epic: Past Medical History:  Diagnosis Date   Abnormal Pap smear of cervix    in late 20's   Childhood asthma    Colon polyps    Diabetes mellitus without complication (HCC)    type 2   Family history of malignant neoplasm of gastrointestinal tract    Family history of polyps in the colon    Hyperlipidemia    Hypertension    Shingles    Patient Active Problem List   Diagnosis Date Noted   GERD (gastroesophageal reflux disease) 01/18/2024   Genetic testing 01/06/2021   Family history of polyps in the colon 12/20/2020   Colon polyps 12/20/2020   Family history of malignant neoplasm of gastrointestinal tract 12/20/2020   Hypertension associated with diabetes (HCC) 12/02/2018   Type 2 diabetes mellitus with hyperglycemia, without long-term current use of insulin (HCC) 12/02/2018   Dyslipidemia associated with type 2 diabetes mellitus (HCC) 12/02/2018   Seasonal allergies 12/02/2018   Family history of colon cancer 12/02/2018   Past Surgical History:  Procedure Laterality Date   APPENDECTOMY     cecum removal      CERVICAL CONE BIOPSY     abnormal pap smear   CHOLECYSTECTOMY     COLONOSCOPY     2014 and 2019 and numerous over the years due to [facility]   METATARSAL OSTEOTOMY WITH BUNIONECTOMY Right 08/02/2022   TONSILLECTOMY AND ADENOIDECTOMY      Family History  Problem Relation Age of Onset   Hypertension Mother    Colon polyps Mother    Hypertension Father    Heart disease Father    Pulmonary fibrosis Father    Diabetes Brother    Hypertension Brother    Colon cancer Maternal Uncle 72   Colon cancer Maternal Grandmother 20       d. 8   Hypertension Maternal Grandmother    Hypertension Maternal Grandfather    Diabetes Paternal Grandmother    Hypertension Paternal Grandmother    Stroke Paternal Grandmother    Hypertension Paternal Grandfather    Colon cancer Other 20       MGMs sister   Breast cancer Other 30       MGMs sister    Medications- reviewed and updated Current Outpatient Medications  Medication Sig Dispense Refill   atorvastatin  (LIPITOR) 20 MG tablet TAKE 1 TABLET BY MOUTH EVERY DAY 90 tablet 2   B Complex Vitamins (B COMPLEX PO) Take by mouth.  cetirizine (ZYRTEC) 10 MG tablet Take 10 mg by mouth daily.     cholecalciferol (VITAMIN D3) 25 MCG (1000 UNIT) tablet Take 1,000 Units by mouth daily.     Cyanocobalamin (B-12 PO) Take by mouth.     tirzepatide  (MOUNJARO ) 7.5 MG/0.5ML Pen INJECT 7.5 MG SUBCUTANEOUSLY WEEKLY 3 mL 2   valsartan  (DIOVAN ) 160 MG tablet TAKE 1 TABLET BY MOUTH EVERY DAY 90 tablet 1   No current facility-administered medications for this visit.    Allergies-reviewed and updated Allergies  Allergen Reactions   Nonoxynol-9     Social History   Socioeconomic History   Marital status: Married    Spouse name: Not on file   Number of children: Not on file   Years of education: Not on file   Highest education level: Bachelor's degree (e.g., BA, AB, BS)  Occupational History   Not on file  Tobacco Use   Smoking status: Never   Smokeless  tobacco: Never  Vaping Use   Vaping status: Never Used  Substance and Sexual Activity   Alcohol use: Yes    Comment: occasionally   Drug use: Never   Sexual activity: Yes    Partners: Male    Birth control/protection: Post-menopausal  Other Topics Concern   Not on file  Social History Narrative   Not on file   Social Drivers of Health   Financial Resource Strain: Low Risk  (01/14/2024)   Overall Financial Resource Strain (CARDIA)    Difficulty of Paying Living Expenses: Not hard at all  Food Insecurity: No Food Insecurity (01/14/2024)   Hunger Vital Sign    Worried About Running Out of Food in the Last Year: Never true    Ran Out of Food in the Last Year: Never true  Transportation Needs: No Transportation Needs (01/14/2024)   PRAPARE - Administrator, Civil Service (Medical): No    Lack of Transportation (Non-Medical): No  Physical Activity: Sufficiently Active (01/14/2024)   Exercise Vital Sign    Days of Exercise per Week: 3 days    Minutes of Exercise per Session: 60 min  Stress: Stress Concern Present (01/14/2024)   [employer] of Occupational Health - Occupational Stress Questionnaire    Feeling of Stress: To some extent  Social Connections: Moderately Integrated (01/14/2024)   Social Connection and Isolation Panel    Frequency of Communication with Friends and Family: More than three times a week    Frequency of Social Gatherings with Friends and Family: Three times a week    Attends Religious Services: 1 to 4 times per year    Active Member of Clubs or Organizations: No    Attends Engineer, structural: Not on file    Marital Status: Married        Objective:  Physical Exam: BP 115/77   Pulse 69   Temp (!) 97.3 F (36.3 C) (Temporal)   Ht 5' 6 (1.676 m)   Wt 181 lb 9.6 oz (82.4 kg)   SpO2 98%   BMI 29.31 kg/m   Body mass index is 29.31 kg/m. Wt Readings from Last 3 Encounters:  01/18/24 181 lb 9.6 oz (82.4 kg)  07/20/23 179 lb  (81.2 kg)  05/07/23 181 lb (82.1 kg)   Gen: NAD, resting comfortably HEENT: TMs normal bilaterally. OP clear. No thyromegaly noted.  CV: RRR with no murmurs appreciated Pulm: NWOB, CTAB with no crackles, wheezes, or rhonchi GI: Normal bowel sounds present. Soft, Nontender, Nondistended. MSK: no edema,  cyanosis, or clubbing noted Skin: warm, dry Neuro: CN2-12 grossly intact. Strength 5/5 in upper and lower extremities. Reflexes symmetric and intact bilaterally.  Psych: Normal affect and thought content     Hondo Nanda M. Kennyth, MD 01/18/2024 1:20 PM

## 2024-01-18 NOTE — Patient Instructions (Signed)
 It was very nice to see you today!  VISIT SUMMARY: Today, you had your annual physical exam. Your blood pressure is well-controlled, and your preventative screenings and vaccinations are up to date. We discussed your elevated triglycerides, stress management, and the importance of maintaining a healthy lifestyle.  YOUR PLAN: ADULT WELLNESS VISIT: Routine wellness visit with well-controlled blood pressure and current preventative screenings and vaccinations. -Continue current medications, including Lipitor and Mounjaro . -Maintain a healthy diet and regular exercise, including hiking. -Get a COVID booster before traveling to [place] and a flu vaccine.  TYPE 2 DIABETES MELLITUS: Your type 2 diabetes is well-controlled with your current medication regimen. -Continue taking Mounjaro  at the current dose. -Engage in regular physical activity, such as hiking, to maintain blood sugar control.  HYPERLIPIDEMIA: Your hyperlipidemia is managed with Lipitor, with an excellent LDL level. -Continue taking Lipitor at the current dose.  GASTROESOPHAGEAL REFLUX DISEASE: You are experiencing increased reflux symptoms, likely due to stress and possibly a side effect of Mounjaro . -Continue taking Pepcid as needed for reflux symptoms. -Monitor symptoms and report if they worsen or require additional intervention.  STRESS RELATED TO CAREGIVING FOR AGING PARENT: You are experiencing significant stress from caregiving for your mother, leading to anxiety attacks and physical symptoms. -Continue using hiking as a stress management technique. -Take time for self-care. -Consider support and resources for counseling if needed.  Return in about 6 months (around 07/20/2024) for Follow Up.   Take care, Dr Kennyth  PLEASE NOTE:  If you had any lab tests, please let us  know if you have not heard back within a few days. You may see your results on mychart before we have a chance to review them but we will give you a call  once they are reviewed by us .   If we ordered any referrals today, please let us  know if you have not heard from their office within the next week.   If you had any urgent prescriptions sent in today, please check with the pharmacy within an hour of our visit to make sure the prescription was transmitted appropriately.   Please try these tips to maintain a healthy lifestyle:  Eat at least 3 REAL meals and 1-2 snacks per day.  Aim for no more than 5 hours between eating.  If you eat breakfast, please do so within one hour of getting up.   Each meal should contain half fruits/vegetables, one quarter protein, and one quarter carbs (no bigger than a computer mouse)  Cut down on sweet beverages. This includes juice, soda, and sweet tea.   Drink at least 1 glass of water with each meal and aim for at least 8 glasses per day  Exercise at least 150 minutes every week.    Preventive Care 53-50 Years Old, Female Preventive care refers to lifestyle choices and visits with your health care provider that can promote health and wellness. Preventive care visits are also called wellness exams. What can I expect for my preventive care visit? Counseling Your health care provider may ask you questions about your: Medical history, including: Past medical problems. Family medical history. Pregnancy history. Current health, including: Menstrual cycle. Method of birth control. Emotional well-being. Home life and relationship well-being. Sexual activity and sexual health. Lifestyle, including: Alcohol, nicotine or tobacco, and drug use. Access to firearms. Diet, exercise, and sleep habits. Work and work Astronomer. Sunscreen use. Safety issues such as seatbelt and bike helmet use. Physical exam Your health care provider will check your: Height  and weight. These may be used to calculate your BMI (body mass index). BMI is a measurement that tells if you are at a healthy weight. Waist circumference.  This measures the distance around your waistline. This measurement also tells if you are at a healthy weight and may help predict your risk of certain diseases, such as type 2 diabetes and high blood pressure. Heart rate and blood pressure. Body temperature. Skin for abnormal spots. What immunizations do I need?  Vaccines are usually given at various ages, according to a schedule. Your health care provider will recommend vaccines for you based on your age, medical history, and lifestyle or other factors, such as travel or where you work. What tests do I need? Screening Your health care provider may recommend screening tests for certain conditions. This may include: Lipid and cholesterol levels. Diabetes screening. This is done by checking your blood sugar (glucose) after you have not eaten for a while (fasting). Pelvic exam and Pap test. Hepatitis B test. Hepatitis C test. HIV (human immunodeficiency virus) test. STI (sexually transmitted infection) testing, if you are at risk. Lung cancer screening. Colorectal cancer screening. Mammogram. Talk with your health care provider about when you should start having regular mammograms. This may depend on whether you have a family history of breast cancer. BRCA-related cancer screening. This may be done if you have a family history of breast, ovarian, tubal, or peritoneal cancers. Bone density scan. This is done to screen for osteoporosis. Talk with your health care provider about your test results, treatment options, and if necessary, the need for more tests. Follow these instructions at home: Eating and drinking  Eat a diet that includes fresh fruits and vegetables, whole grains, lean protein, and low-fat dairy products. Take vitamin and mineral supplements as recommended by your health care provider. Do not drink alcohol if: Your health care provider tells you not to drink. You are pregnant, may be pregnant, or are planning to become  pregnant. If you drink alcohol: Limit how much you have to 0-1 drink a day. Know how much alcohol is in your drink. In the [place] one drink equals one 12 oz bottle of beer (355 mL), one 5 oz glass of wine (148 mL), or one 1 oz glass of hard liquor (44 mL). Lifestyle Brush your teeth every morning and night with fluoride toothpaste. Floss one time each day. Exercise for at least 30 minutes 5 or more days each week. Do not use any products that contain nicotine or tobacco. These products include cigarettes, chewing tobacco, and vaping devices, such as e-cigarettes. If you need help quitting, ask your health care provider. Do not use drugs. If you are sexually active, practice safe sex. Use a condom or other form of protection to prevent STIs. If you do not wish to become pregnant, use a form of birth control. If you plan to become pregnant, see your health care provider for a prepregnancy visit. Take aspirin only as told by your health care provider. Make sure that you understand how much to take and what form to take. Work with your health care provider to find out whether it is safe and beneficial for you to take aspirin daily. Find healthy ways to manage stress, such as: Meditation, yoga, or listening to music. Journaling. Talking to a trusted person. Spending time with friends and family. Minimize exposure to UV radiation to reduce your risk of skin cancer. Safety Always wear your seat belt while driving or riding in a  vehicle. Do not drive: If you have been drinking alcohol. Do not ride with someone who has been drinking. When you are tired or distracted. While texting. If you have been using any mind-altering substances or drugs. Wear a helmet and other protective equipment during sports activities. If you have firearms in your house, make sure you follow all gun safety procedures. Seek help if you have been physically or sexually abused. What's next? Visit your health care provider  once a year for an annual wellness visit. Ask your health care provider how often you should have your eyes and teeth checked. Stay up to date on all vaccines. This information is not intended to replace advice given to you by your health care provider. Make sure you discuss any questions you have with your health care provider. Document Revised: 11/10/2020 Document Reviewed: 11/10/2020 Elsevier Patient Education  2024 [employer].

## 2024-01-23 ENCOUNTER — Other Ambulatory Visit: Payer: Self-pay | Admitting: *Deleted

## 2024-01-23 MED ORDER — VALSARTAN 160 MG PO TABS: 160.0000 mg | ORAL_TABLET | Freq: Every day | ORAL | 1 refills | Status: AC

## 2024-01-23 NOTE — Telephone Encounter (Signed)
 Rx Valsartan  refill send to pharmacy the rest has one refill left

## 2024-02-18 ENCOUNTER — Other Ambulatory Visit: Payer: Self-pay | Admitting: Family Medicine

## 2024-02-19 ENCOUNTER — Other Ambulatory Visit (HOSPITAL_COMMUNITY): Payer: Self-pay

## 2024-02-19 ENCOUNTER — Telehealth: Payer: Self-pay

## 2024-02-19 NOTE — Telephone Encounter (Signed)
 Please start a PA thanks

## 2024-02-19 NOTE — Telephone Encounter (Signed)
 Pharmacy Patient Advocate Encounter   Received notification from Physician's Office that prior authorization for Mounjaro  7.5MG /0.5ML auto-injectors  is required/requested.   Insurance verification completed.   The patient is insured through [facility] .   Per test claim: PA required; PA submitted to above mentioned insurance via Latent Key/confirmation #/EOC [id number] Status is pending

## 2024-02-20 NOTE — Telephone Encounter (Signed)
 PA submitted and pending.

## 2024-02-22 ENCOUNTER — Other Ambulatory Visit (HOSPITAL_COMMUNITY): Payer: Self-pay

## 2024-02-22 NOTE — Telephone Encounter (Signed)
 Pharmacy Patient Advocate Encounter  Received notification from [facility] that Prior Authorization for Mounjaro  7.5MG /0.5ML auto-injectors  has been APPROVED from 02/19/24 to 02/17/25   PA #/Case ID/Reference #: [id number]

## 2024-02-25 ENCOUNTER — Other Ambulatory Visit: Payer: Self-pay | Admitting: Family Medicine

## 2024-03-04 ENCOUNTER — Other Ambulatory Visit: Payer: Self-pay | Admitting: Family Medicine

## 2024-03-04 DIAGNOSIS — Z1231 Encounter for screening mammogram for malignant neoplasm of breast: Secondary | ICD-10-CM

## 2024-03-06 DIAGNOSIS — H2513 Age-related nuclear cataract, bilateral: Secondary | ICD-10-CM | POA: Diagnosis not present

## 2024-03-06 DIAGNOSIS — E119 Type 2 diabetes mellitus without complications: Secondary | ICD-10-CM | POA: Diagnosis not present

## 2024-03-06 LAB — OPHTHALMOLOGY REPORT-SCANNED

## 2024-03-24 NOTE — Progress Notes (Unsigned)
 Cardiology Office Note:   Date:  03/27/2024  ID:  April Clayton, DOB 1965/06/08, MRN [id number] PCP:  Kennyth Worth HERO, MD  [facility] Providers Cardiologist:  Wendel Haws, MD Referring MD: Kennyth Worth HERO, MD  Chief Complaint/Reason for Referral: Family history of coronary artery disease ASSESSMENT:    1. Precordial pain   2. Type 2 diabetes mellitus without complication, without long-term current use of insulin (HCC)   3. Hypertension associated with diabetes (HCC)   4. Hyperlipidemia associated with type 2 diabetes mellitus (HCC)   5. BMI 29.0-29.9,adult     PLAN:   In order of problems listed above: Chest pain:  We will obtain a coronary CTA and echocardiogram to evaluate further.  If the patient has mild obstructive coronary artery disease, they will require a statin (with goal LDL < 70) and aspirin, if they have high-grade disease we will need to consider optimal medical therapy and if symptoms are refractory to medical therapy, then a cardiac catheterization with possible PCI will be pursued to alleviate symptoms.  If they have high risk disease we will proceed directly to cardiac catheterization.   T2DM: Continue aspirin 81 mg, continue atorvastatin  20 mg, valsartan  160 mg, start Jardiance 10 mg Hypertension: Continue valsartan  160 mg.  Blood pressure is well-controlled today. Hyperlipidemia: Continue atorvastatin  20 mg.  LDL was 57 last month.  Check LpA today. Elevated BMI: Continue Mounjaro  7.5 mg q. weekly            Dispo:  Return in about 6 months (around 09/25/2024).       I spent 45 minutes reviewing all clinical data during and prior to this visit including all relevant imaging studies, laboratories, clinical information from other health systems and prior notes from both Cardiology and other specialties, interviewing the patient, conducting a complete physical examination, and coordinating care in order to formulate a comprehensive and personalized  evaluation and treatment plan.   History of Present Illness:    FOCUSED PROBLEM LIST:   FH of CAD T2DM Not on insulin Hypertension Hyperlipidemia BMI 26 March 2024:  Patient consents to use of AI scribe. The patient is a 58 year old female with the above listed medical problems here for recommendations regarding family history of coronary artery disease.  The patient was seen by their PCP recently.  She is doing well without chest pain or significant shortness of breath.  Blood pressure is well-controlled.  She experiences chest tightness occasionally.  This sometimes happens with exertion.  She has not had any significant shortness of breath. Her family history is significant for heart disease; her father had coronary artery blockages and underwent a double bypass surgery. He later developed atrial fibrillation, possibly related to steroid use for pulmonary fibrosis. Her grandmother also had atrial fibrillation. There is a history of high blood pressure on both sides of her family.  She is a retired engineer, civil (consulting) and has a history of being physically active, including hiking [place]. Hiking helps manage her stress and maintains her blood pressure and heart rate at optimal levels. However, her physical activity has decreased recently due to caregiving responsibilities for her mother.  No muscle or joint aches related to her cholesterol medication, Lipitor. No swelling in her legs or breathing difficulties when lying flat.     Current Medications: Current Meds  Medication Sig   aspirin EC 81 MG tablet Take 81 mg by mouth daily. Swallow whole.   atorvastatin  (LIPITOR) 20 MG tablet TAKE 1 TABLET BY MOUTH  EVERY DAY   B Complex Vitamins (B COMPLEX PO) Take by mouth.   cetirizine (ZYRTEC) 10 MG tablet Take 10 mg by mouth daily.   cholecalciferol (VITAMIN D3) 25 MCG (1000 UNIT) tablet Take 1,000 Units by mouth daily.   Cyanocobalamin (B-12 PO) Take by mouth.   empagliflozin  (JARDIANCE) 10 MG TABS tablet Take 1 tablet (10 mg total) by mouth daily.   metoprolol  tartrate (LOPRESSOR ) 100 MG tablet Take 1 tablet (100 mg total) by mouth once for 1 dose. Take 90-120 minutes prior to scan. Hold for SBP less than 110.   tirzepatide  (MOUNJARO ) 7.5 MG/0.5ML Pen Inject 7.5 mg into the skin once a week.   valsartan  (DIOVAN ) 160 MG tablet Take 1 tablet (160 mg total) by mouth daily.     Review of Systems:   Please see the history of present illness.    All other systems reviewed and are negative.     EKGs/Labs/Other Test Reviewed:   EKG:    EKG Interpretation Date/Time:  Thursday March 27 2024 11:23:42 EDT Ventricular Rate:  81 PR Interval:  144 QRS Duration:  88 QT Interval:  368 QTC Calculation: 427 R Axis:   -14  Text Interpretation: Normal sinus rhythm Inferior infarct , age undetermined No previous ECGs available Confirmed by Wendel Haws ([id number]) on 03/27/2024 11:43:58 AM        CARDIAC STUDIES: Refer to CV Procedures and Imaging Tabs   Risk Assessment/Calculations:          Physical Exam:   VS:  BP 122/66 (BP Location: Right Arm, Patient Position: Sitting, Cuff Size: Large)   Pulse 81   Ht 5' 7 (1.702 m)   SpO2 98%   BMI 28.44 kg/m        Wt Readings from Last 3 Encounters:  01/18/24 181 lb 9.6 oz (82.4 kg)  07/20/23 179 lb (81.2 kg)  05/07/23 181 lb (82.1 kg)      GENERAL:  No apparent distress, AOx3 HEENT:  No carotid bruits, +2 carotid impulses, no scleral icterus CAR: RRR no murmurs, gallops, rubs, or thrills RES:  Clear to auscultation bilaterally ABD:  Soft, nontender, nondistended, positive bowel sounds x 4 VASC:  +2 radial pulses, +2 carotid pulses NEURO:  CN 2-12 grossly intact; motor and sensory grossly intact PSYCH:  No active depression or anxiety EXT:  No edema, ecchymosis, or cyanosis  Signed, Haws MARLA Wendel, MD  03/27/2024 12:21 PM    [facility] [address] [place], [place], [place]   [address] Phone: [phone]; Fax: [phone]   Note:  This document was prepared using Dragon voice recognition software and may include unintentional dictation errors.

## 2024-03-25 DIAGNOSIS — L710 Perioral dermatitis: Secondary | ICD-10-CM | POA: Diagnosis not present

## 2024-03-25 DIAGNOSIS — L578 Other skin changes due to chronic exposure to nonionizing radiation: Secondary | ICD-10-CM | POA: Diagnosis not present

## 2024-03-25 DIAGNOSIS — L814 Other melanin hyperpigmentation: Secondary | ICD-10-CM | POA: Diagnosis not present

## 2024-03-25 DIAGNOSIS — Z808 Family history of malignant neoplasm of other organs or systems: Secondary | ICD-10-CM | POA: Diagnosis not present

## 2024-03-27 ENCOUNTER — Other Ambulatory Visit: Payer: Self-pay | Admitting: Family Medicine

## 2024-03-27 ENCOUNTER — Ambulatory Visit: Attending: Internal Medicine | Admitting: Internal Medicine

## 2024-03-27 ENCOUNTER — Encounter: Payer: Self-pay | Admitting: Internal Medicine

## 2024-03-27 VITALS — BP 122/66 | HR 81 | Ht 67.0 in

## 2024-03-27 DIAGNOSIS — E1169 Type 2 diabetes mellitus with other specified complication: Secondary | ICD-10-CM | POA: Diagnosis not present

## 2024-03-27 DIAGNOSIS — E785 Hyperlipidemia, unspecified: Secondary | ICD-10-CM

## 2024-03-27 DIAGNOSIS — I152 Hypertension secondary to endocrine disorders: Secondary | ICD-10-CM

## 2024-03-27 DIAGNOSIS — Z6829 Body mass index (BMI) 29.0-29.9, adult: Secondary | ICD-10-CM

## 2024-03-27 DIAGNOSIS — E1159 Type 2 diabetes mellitus with other circulatory complications: Secondary | ICD-10-CM | POA: Diagnosis not present

## 2024-03-27 DIAGNOSIS — R072 Precordial pain: Secondary | ICD-10-CM

## 2024-03-27 DIAGNOSIS — E119 Type 2 diabetes mellitus without complications: Secondary | ICD-10-CM

## 2024-03-27 MED ORDER — METOPROLOL TARTRATE 100 MG PO TABS: 100.0000 mg | ORAL_TABLET | Freq: Once | ORAL | 0 refills | Status: DC

## 2024-03-27 MED ORDER — EMPAGLIFLOZIN 10 MG PO TABS: 10.0000 mg | ORAL_TABLET | Freq: Every day | ORAL | 11 refills | Status: DC

## 2024-03-27 NOTE — Patient Instructions (Signed)
 Medication Instructions:  Your physician has recommended you make the following change in your medication:   1) START empagliflozin (Jardiance) 10 mg daily with breakfast  *If you need a refill on your cardiac medications before your next appointment, please call your pharmacy*  Lab Work: TODAY: LP(a) (go to lab on 1st floor, next to pharmacy) If you have labs (blood work) drawn today and your tests are completely normal, you will receive your results only by: MyChart Message (if you have MyChart) OR A paper copy in the mail If you have any lab test that is abnormal or we need to change your treatment, we will call you to review the results.  Testing/Procedures: ECHO Your physician has requested that you have an echocardiogram. Echocardiography is a painless test that uses sound waves to create images of your heart. It provides your doctor with information about the size and shape of your heart and how well your heart's chambers and valves are working. This procedure takes approximately one hour. There are no restrictions for this procedure. Please do NOT wear cologne, perfume, aftershave, or lotions (deodorant is allowed). Please arrive 15 minutes prior to your appointment time.  Please note: We ask at that you not bring children with you during ultrasound (echo/ vascular) testing. Due to room size and safety concerns, children are not allowed in the ultrasound rooms during exams. Our front office staff cannot provide observation of children in our lobby area while testing is being conducted. An adult accompanying a patient to their appointment will only be allowed in the ultrasound room at the discretion of the ultrasound technician under special circumstances. We apologize for any inconvenience.  CARDIAC CT Your physician has requested that you have cardiac CT. Cardiac computed tomography (CT) is a painless test that uses an x-ray machine to take clear, detailed pictures of your heart. For  further information please visit [email or web address] Please follow instruction sheet as given.  Follow-Up: At [facility], you and your health needs are our priority.  As part of our continuing mission to provide you with exceptional heart care, our providers are all part of one team.  This team includes your primary Cardiologist (physician) and Advanced Practice Providers or APPs (Physician Assistants and Nurse Practitioners) who all work together to provide you with the care you need, when you need it.  Your next appointment:   6 month(s)  Provider:   Glendia Ferrier, PA-C   We recommend signing up for the patient portal called MyChart.  Sign up information is provided on this After Visit Summary.  MyChart is used to connect with patients for Virtual Visits (Telemedicine).  Patients are able to view lab/test results, encounter notes, upcoming appointments, etc.  Non-urgent messages can be sent to your provider as well.    To learn more about what you can do with MyChart, go to [email or web address].   Other Instructions   Your cardiac CT will be scheduled at one of the below locations:   [facility] [address] [place], [place] [address] [phone] (Severe contrast allergies only)  OR   [facility] [address] [place], [place] [address] [phone]  OR   [facility] [address] [place], [place] [address] [phone]  OR   [facility] and Vascular Tower [address]  [place], [place] [address]  OR   [facility] [address] [place], [place] [phone]  If scheduled at [facility],  please arrive at the [facility] and Children's Entrance (Entrance C2) of [facility] 30 minutes prior to test start time. You can use the FREE valet parking offered at entrance C (encouraged to control the heart rate for the test)  Proceed to the [facility] Radiology  Department (first floor) to check-in and test prep.  All radiology patients and guests should use entrance C2 at [facility], accessed from [facility], even though the hospital's physical address listed is [address]  If scheduled at the Heart and Vascular Tower at [address], please enter the parking lot using the [address] entrance and use the FREE valet service at the patient drop-off area. Enter the building and check-in with registration on the main floor.  If scheduled at [facility], please arrive to the Heart and Vascular Center 15 mins early for check-in and test prep.  There is spacious parking and easy access to the radiology department from the [facility] V[facility] entrance. Please enter here and check-in with the desk attendant.   If scheduled at [facility], please arrive 30 minutes early for check-in and test prep.  Please follow these instructions carefully (unless otherwise directed):  An IV will be required for this test and Nitroglycerin will be given.   On the Night Before the Test: Be sure to Drink plenty of water. Do not consume any caffeinated/decaffeinated beverages or chocolate 12 hours prior to your test. Do not take any antihistamines 12 hours prior to your test.  On the Day of the Test: Drink plenty of water until 1 hour prior to the test. Do not eat any food 1 hour prior to test. You may take your regular medications prior to the test.  Take metoprolol  (Lopressor ) 100 mg two hours prior to test. If you take Furosemide /Hydrochlorothiazide/Spironolactone/Chlorthalidone , please HOLD on the morning of the test. Patients who wear a continuous glucose monitor MUST remove the device prior to scanning. FEMALES- please wear underwire-free bra if available, avoid dresses & tight clothing  After the Test: Drink plenty of water. After receiving IV contrast, you may experience a mild flushed  feeling. This is normal. On occasion, you may experience a mild rash up to 24 hours after the test. This is not dangerous. If this occurs, you can take Benadryl 25 mg, Zyrtec, Claritin, or Allegra and increase your fluid intake. (Patients taking Tikosyn should avoid Benadryl, and may take Zyrtec, Claritin, or Allegra) If you experience trouble breathing, this can be serious. If it is severe call 911 IMMEDIATELY. If it is mild, please call our office.  We will call to schedule your test 2-4 weeks out understanding that some insurance companies will need an authorization prior to the service being performed.   For more information and frequently asked questions, please visit our website : [email or web address]  For non-scheduling related questions, please contact the cardiac imaging nurse navigator should you have any questions/concerns: Cardiac Imaging Nurse Navigators Direct Office Dial: [phone]   For scheduling needs, including cancellations and rescheduling, please call Brittany, [phone].

## 2024-03-31 ENCOUNTER — Ambulatory Visit: Payer: Self-pay | Admitting: Internal Medicine

## 2024-03-31 LAB — LIPOPROTEIN A (LPA): Lipoprotein (a): <8.4 nmol/L (ref <75.0)

## 2024-04-04 ENCOUNTER — Encounter (HOSPITAL_COMMUNITY): Payer: Self-pay

## 2024-04-08 ENCOUNTER — Ambulatory Visit (HOSPITAL_COMMUNITY)
Admission: RE | Admit: 2024-04-08 | Discharge: 2024-04-08 | Disposition: A | Discharge status: Home / Self Care | Source: Ambulatory Visit | Attending: Cardiovascular Disease | Admitting: Cardiovascular Disease

## 2024-04-08 ENCOUNTER — Telehealth: Payer: Self-pay | Admitting: Internal Medicine

## 2024-04-08 DIAGNOSIS — R072 Precordial pain: Secondary | ICD-10-CM | POA: Diagnosis not present

## 2024-04-08 LAB — POCT I-STAT CREATININE: Creatinine, Ser: 0.9 mg/dL (ref 0.44–1.00)

## 2024-04-08 MED ORDER — IOHEXOL 350 MG/ML SOLN
100.0000 mL | Freq: Once | INTRAVENOUS | Status: AC | PRN
  Administered 2024-04-08: 100 mL via INTRAVENOUS

## 2024-04-08 MED ORDER — NITROGLYCERIN 0.4 MG SL SUBL
0.8000 mg | SUBLINGUAL_TABLET | Freq: Once | SUBLINGUAL | Status: AC
  Administered 2024-04-08: 0.8 mg via SUBLINGUAL

## 2024-04-08 NOTE — Telephone Encounter (Signed)
 Paper Work Dropped Off: EKG  Date: 04/08/2024  Location of paper:  Dr. Wendel mailbox

## 2024-04-21 ENCOUNTER — Other Ambulatory Visit: Payer: Self-pay | Admitting: *Deleted

## 2024-04-21 MED ORDER — EMPAGLIFLOZIN 10 MG PO TABS: 10.0000 mg | ORAL_TABLET | Freq: Every day | ORAL | 1 refills | Status: AC

## 2024-05-07 ENCOUNTER — Encounter: Payer: Self-pay | Admitting: Obstetrics and Gynecology

## 2024-05-07 ENCOUNTER — Ambulatory Visit (INDEPENDENT_AMBULATORY_CARE_PROVIDER_SITE_OTHER): Admitting: Obstetrics and Gynecology

## 2024-05-07 ENCOUNTER — Ambulatory Visit: Admitting: Obstetrics and Gynecology

## 2024-05-07 VITALS — BP 118/76 | HR 81 | Temp 98.2°F | Ht 67.25 in | Wt 183.0 lb

## 2024-05-07 DIAGNOSIS — Z1331 Encounter for screening for depression: Secondary | ICD-10-CM

## 2024-05-07 DIAGNOSIS — Z01419 Encounter for gynecological examination (general) (routine) without abnormal findings: Secondary | ICD-10-CM | POA: Diagnosis not present

## 2024-05-07 NOTE — Progress Notes (Signed)
 58 y.o. [id number] female here for annual exam. Married. Retired Nutritional Therapist. Moved to [place] from [place] in 2020. Son. PCP: Kennyth Worth HERO, MD   No LMP recorded. Patient is postmenopausal.   She reports no concerns today. Doing well. Caregiving for her mother who had 2 stroke this year. Urine sample provided: No  Abnormal bleeding: none Pelvic discharge or pain: none Breast mass, nipple discharge or skin changes : none  Sexually active: yes Birth control: none Last PAP:     Component Value Date/Time   DIAGPAP  05/07/2023 1054    - Negative for intraepithelial lesion or malignancy (NILM)   DIAGPAP  05/03/2020 1147    - Negative for intraepithelial lesion or malignancy (NILM)   HPVHIGH Negative 05/07/2023 1054   HPVHIGH Negative 05/03/2020 1147   ADEQPAP  05/07/2023 1054    Satisfactory for evaluation; transformation zone component PRESENT.   ADEQPAP  05/03/2020 1147    Satisfactory for evaluation; transformation zone component PRESENT.   Last mammogram: 07/13/22 Birads 1, Density A Last colonoscopy: 09/24/2023 with [facility], repeat 5 years per patient - Care Everywhere  Exercising: yes, piliates and hiking Smoker: No  Flowsheet Row Office Visit from 05/07/2024 in [facility]  PHQ-2 Total Score 0       GYN HISTORY: Prior [facility], >1yr ago  OB History  Gravida Para Term Preterm AB Living  1 1 1   1   SAB IAB Ectopic Multiple Live Births      1    # Outcome Date GA Lbr Len/2nd Weight Sex Type Anes PTL Lv  1 Term     M Vag-Spont   LIV   Past Medical History:  Diagnosis Date   Abnormal Pap smear of cervix    in late 20's   Childhood asthma    Colon polyps    Diabetes mellitus without complication (HCC)    type 2   Family history of malignant neoplasm of gastrointestinal tract    Family history of polyps in the colon    Hyperlipidemia    Hypertension    Shingles    Past Surgical History:  Procedure Laterality Date   APPENDECTOMY     cecum  removal     CERVICAL CONE BIOPSY     abnormal pap smear   CHOLECYSTECTOMY     COLONOSCOPY     2014 and 2019 and numerous over the years due to [facility]   METATARSAL OSTEOTOMY WITH BUNIONECTOMY Right 08/02/2022   TONSILLECTOMY AND ADENOIDECTOMY     Current Outpatient Medications on File Prior to Visit  Medication Sig Dispense Refill   aspirin EC 81 MG tablet Take 81 mg by mouth daily. Swallow whole.     atorvastatin  (LIPITOR) 20 MG tablet TAKE 1 TABLET BY MOUTH EVERY DAY 90 tablet 2   B Complex Vitamins (B COMPLEX PO) Take by mouth.     cetirizine (ZYRTEC) 10 MG tablet Take 10 mg by mouth daily.     cholecalciferol (VITAMIN D3) 25 MCG (1000 UNIT) tablet Take 1,000 Units by mouth daily.     empagliflozin  (JARDIANCE ) 10 MG TABS tablet Take 1 tablet (10 mg total) by mouth daily. 90 tablet 1   tirzepatide  (MOUNJARO ) 7.5 MG/0.5ML Pen INJECT 7.5 MG SUBCUTANEOUSLY WEEKLY 6 mL 0   valsartan  (DIOVAN ) 160 MG tablet Take 1 tablet (160 mg total) by mouth daily. 90 tablet 1   No current facility-administered medications on file prior to visit.   Social History  Socioeconomic History   Marital status: Married    Spouse name: Not on file   Number of children: Not on file   Years of education: Not on file   Highest education level: Bachelor's degree (e.g., BA, AB, BS)  Occupational History   Not on file  Tobacco Use   Smoking status: Never   Smokeless tobacco: Never  Vaping Use   Vaping status: Never Used  Substance and Sexual Activity   Alcohol use: Yes    Comment: occasionally   Drug use: Never   Sexual activity: Yes    Partners: Male    Birth control/protection: Post-menopausal  Other Topics Concern   Not on file  Social History Narrative   Not on file   Social Drivers of Health   Financial Resource Strain: Low Risk  (01/14/2024)   Overall Financial Resource Strain (CARDIA)    Difficulty of Paying Living Expenses: Not hard at all  Food Insecurity: No Food Insecurity (01/14/2024)    Hunger Vital Sign    Worried About Running Out of Food in the Last Year: Never true    Ran Out of Food in the Last Year: Never true  Transportation Needs: No Transportation Needs (01/14/2024)   PRAPARE - Administrator, Civil Service (Medical): No    Lack of Transportation (Non-Medical): No  Physical Activity: Sufficiently Active (01/14/2024)   Exercise Vital Sign    Days of Exercise per Week: 3 days    Minutes of Exercise per Session: 60 min  Stress: Stress Concern Present (01/14/2024)   [employer] of Occupational Health - Occupational Stress Questionnaire    Feeling of Stress: To some extent  Social Connections: Moderately Integrated (01/14/2024)   Social Connection and Isolation Panel    Frequency of Communication with Friends and Family: More than three times a week    Frequency of Social Gatherings with Friends and Family: Three times a week    Attends Religious Services: 1 to 4 times per year    Active Member of Clubs or Organizations: No    Attends Engineer, Structural: Not on file    Marital Status: Married  Catering Manager Violence: Not on file   Family History  Problem Relation Age of Onset   Hypertension Mother    Colon polyps Mother    Stroke Mother    Hypertension Father    Heart disease Father    Pulmonary fibrosis Father    Diabetes Brother    Hypertension Brother    Colon cancer Maternal Uncle 72   Colon cancer Maternal Grandmother 20       d. 66   Hypertension Maternal Grandmother    Hypertension Maternal Grandfather    Diabetes Paternal Grandmother    Hypertension Paternal Grandmother    Stroke Paternal Grandmother    Hypertension Paternal Grandfather    Colon cancer Other 20       MGMs sister   Breast cancer Other 30       MGMs sister   Allergies  Allergen Reactions   Nonoxynol-9      PE Today's Vitals   05/07/24 1416  BP: 118/76  Pulse: 81  Temp: 98.2 F (36.8 C)  TempSrc: Oral  SpO2: 98%  Weight: 183 lb (83 kg)   Height: 5' 7.25 (1.708 m)   Body mass index is 28.45 kg/m.  Physical Exam Vitals reviewed. Exam conducted with a chaperone present.  Constitutional:      General: She is not in acute distress.  Appearance: Normal appearance.  HENT:     Head: Normocephalic and atraumatic.     Nose: Nose normal.  Eyes:     Extraocular Movements: Extraocular movements intact.     Conjunctiva/sclera: Conjunctivae normal.  Pulmonary:     Effort: Pulmonary effort is normal.  Chest:     Chest wall: No mass or tenderness.  Breasts:    Right: Normal. No swelling, mass, nipple discharge, skin change or tenderness.     Left: Normal. No swelling, mass, nipple discharge, skin change or tenderness.  Abdominal:     General: There is no distension.     Palpations: Abdomen is soft.     Tenderness: There is no abdominal tenderness.  Genitourinary:    General: Normal vulva.     Exam position: Lithotomy position.     Urethra: No prolapse.     Vagina: Normal. No vaginal discharge or bleeding.     Cervix: Normal. No lesion.     Uterus: Normal. Not enlarged and not tender.      Adnexa: Right adnexa normal and left adnexa normal.  Musculoskeletal:        General: Normal range of motion.     Cervical back: Normal range of motion.  Lymphadenopathy:     Upper Body:     Right upper body: No axillary adenopathy.     Left upper body: No axillary adenopathy.     Lower Body: No right inguinal adenopathy. No left inguinal adenopathy.  Skin:    General: Skin is warm and dry.  Neurological:     General: No focal deficit present.     Mental Status: She is alert.  Psychiatric:        Mood and Affect: Mood normal.        Behavior: Behavior normal.       Assessment and Plan:        Well woman exam with routine gynecological exam Assessment & Plan: Cervical cancer screening performed according to ASCCP guidelines. Encouraged annual mammogram screening Colonoscopy UTD DXA scheduled Feb 2026 Labs and  immunizations with her primary Encouraged safe sexual practices as indicated Encouraged healthy lifestyle practices with diet and exercise For patients under 50-70yo, I recommend 1200mg  calcium  daily and 600IU of vitamin D daily.    Negative depression screening   Vera LULLA Pa, MD

## 2024-05-07 NOTE — Patient Instructions (Signed)
 For patients under 50-58yo, I recommend 1200mg  calcium  daily and 600IU of vitamin D daily. For patients over 58yo, I recommend 1200mg  calcium  daily and 800IU of vitamin D daily.  Health Maintenance, Female Adopting a healthy lifestyle and getting preventive care are important in promoting health and wellness. Ask your health care provider about: The right schedule for you to have regular tests and exams. Things you can do on your own to prevent diseases and keep yourself healthy. What should I know about diet, weight, and exercise? Eat a healthy diet  Eat a diet that includes plenty of vegetables, fruits, low-fat dairy products, and lean protein. Do not eat a lot of foods that are high in solid fats, added sugars, or sodium. Maintain a healthy weight Body mass index (BMI) is used to identify weight problems. It estimates body fat based on height and weight. Your health care provider can help determine your BMI and help you achieve or maintain a healthy weight. Get regular exercise Get regular exercise. This is one of the most important things you can do for your health. Most adults should: Exercise for at least 150 minutes each week. The exercise should increase your heart rate and make you sweat (moderate-intensity exercise). Do strengthening exercises at least twice a week. This is in addition to the moderate-intensity exercise. Spend less time sitting. Even light physical activity can be beneficial. Watch cholesterol and blood lipids Have your blood tested for lipids and cholesterol at 58 years of age, then have this test every 5 years. Have your cholesterol levels checked more often if: Your lipid or cholesterol levels are high. You are older than 58 years of age. You are at high risk for heart disease. What should I know about cancer screening? Depending on your health history and family history, you may need to have cancer screening at various ages. This may include screening  for: Breast cancer. Cervical cancer. Colorectal cancer. Skin cancer. Lung cancer. What should I know about heart disease, diabetes, and high blood pressure? Blood pressure and heart disease High blood pressure causes heart disease and increases the risk of stroke. This is more likely to develop in people who have high blood pressure readings or are overweight. Have your blood pressure checked: Every 3-5 years if you are 25-57 years of age. Every year if you are 24 years old or older. Diabetes Have regular diabetes screenings. This checks your fasting blood sugar level. Have the screening done: Once every three years after age 62 if you are at a normal weight and have a low risk for diabetes. More often and at a younger age if you are overweight or have a high risk for diabetes. What should I know about preventing infection? Hepatitis B If you have a higher risk for hepatitis B, you should be screened for this virus. Talk with your health care provider to find out if you are at risk for hepatitis B infection. Hepatitis C Testing is recommended for: Everyone born from [birth year] through 1965. Anyone with known risk factors for hepatitis C. Sexually transmitted infections (STIs) Get screened for STIs, including gonorrhea and chlamydia, if: You are sexually active and are younger than 58 years of age. You are older than 58 years of age and your health care provider tells you that you are at risk for this type of infection. Your sexual activity has changed since you were last screened, and you are at increased risk for chlamydia or gonorrhea. Ask your health care provider if  you are at risk. Ask your health care provider about whether you are at high risk for HIV. Your health care provider may recommend a prescription medicine to help prevent HIV infection. If you choose to take medicine to prevent HIV, you should first get tested for HIV. You should then be tested every 3 months for as long as you  are taking the medicine. Osteoporosis and menopause Osteoporosis is a disease in which the bones lose minerals and strength with aging. This can result in bone fractures. If you are 72 years old or older, or if you are at risk for osteoporosis and fractures, ask your health care provider if you should: Be screened for bone loss. Take a calcium  or vitamin D supplement to lower your risk of fractures. Be given hormone replacement therapy (HRT) to treat symptoms of menopause. Follow these instructions at home: Alcohol use Do not drink alcohol if: Your health care provider tells you not to drink. You are pregnant, may be pregnant, or are planning to become pregnant. If you drink alcohol: Limit how much you have to: 0-1 drink a day. Know how much alcohol is in your drink. In the [place] one drink equals one 12 oz bottle of beer (355 mL), one 5 oz glass of wine (148 mL), or one 1 oz glass of hard liquor (44 mL). Lifestyle Do not use any products that contain nicotine or tobacco. These products include cigarettes, chewing tobacco, and vaping devices, such as e-cigarettes. If you need help quitting, ask your health care provider. Do not use street drugs. Do not share needles. Ask your health care provider for help if you need support or information about quitting drugs. General instructions Schedule regular health, dental, and eye exams. Stay current with your vaccines. Tell your health care provider if: You often feel depressed. You have ever been abused or do not feel safe at home. Summary Adopting a healthy lifestyle and getting preventive care are important in promoting health and wellness. Follow your health care provider's instructions about healthy diet, exercising, and getting tested or screened for diseases. Follow your health care provider's instructions on monitoring your cholesterol and blood pressure. This information is not intended to replace advice given to you by your health  care provider. Make sure you discuss any questions you have with your health care provider. Document Revised: 10/04/2020 Document Reviewed: 10/04/2020 Elsevier Patient Education  2024 [employer].

## 2024-05-07 NOTE — Assessment & Plan Note (Signed)
 Cervical cancer screening performed according to ASCCP guidelines. Encouraged annual mammogram screening Colonoscopy UTD DXA scheduled Feb 2026 Labs and immunizations with her primary Encouraged safe sexual practices as indicated Encouraged healthy lifestyle practices with diet and exercise For patients under 50-58yo, I recommend 1200mg  calcium  daily and 600IU of vitamin D daily.

## 2024-06-14 ENCOUNTER — Other Ambulatory Visit: Payer: Self-pay | Admitting: Family Medicine

## 2024-07-09 ENCOUNTER — Ambulatory Visit

## 2024-07-10 ENCOUNTER — Other Ambulatory Visit (HOSPITAL_BASED_OUTPATIENT_CLINIC_OR_DEPARTMENT_OTHER)

## 2024-07-21 ENCOUNTER — Ambulatory Visit (HOSPITAL_COMMUNITY)

## 2024-07-22 ENCOUNTER — Ambulatory Visit: Admitting: Family Medicine

## 2024-08-22 ENCOUNTER — Ambulatory Visit: Admitting: Internal Medicine

## 2025-01-20 ENCOUNTER — Encounter: Admitting: Family Medicine
